# Patient Record
Sex: Male | Born: 1995 | Race: Black or African American | Hispanic: No | Marital: Single | Smoking: Former smoker
Health system: Southern US, Community
[De-identification: ages and names within clinical notes are randomized; demographics above are authoritative.]

## PROBLEM LIST (undated history)

## (undated) DIAGNOSIS — B20 Human immunodeficiency virus [HIV] disease: Secondary | ICD-10-CM

## (undated) DIAGNOSIS — Z21 Asymptomatic human immunodeficiency virus [HIV] infection status: Secondary | ICD-10-CM

## (undated) DIAGNOSIS — E669 Obesity, unspecified: Secondary | ICD-10-CM

## (undated) DIAGNOSIS — E119 Type 2 diabetes mellitus without complications: Secondary | ICD-10-CM

## (undated) HISTORY — DX: Asymptomatic human immunodeficiency virus (hiv) infection status: Z21

## (undated) HISTORY — DX: Human immunodeficiency virus (HIV) disease: B20

## (undated) HISTORY — DX: Obesity, unspecified: E66.9

## (undated) HISTORY — DX: Type 2 diabetes mellitus without complications: E11.9

---
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
## 2018-05-19 IMAGING — CR DG CHEST 2V
2 series · 2 of 2 positions shown · non-contrast
Comparison: 11/05/2009

CLINICAL DATA: Chest pain, productive cough

EXAM:
CHEST  2 VIEW

[chest pa]
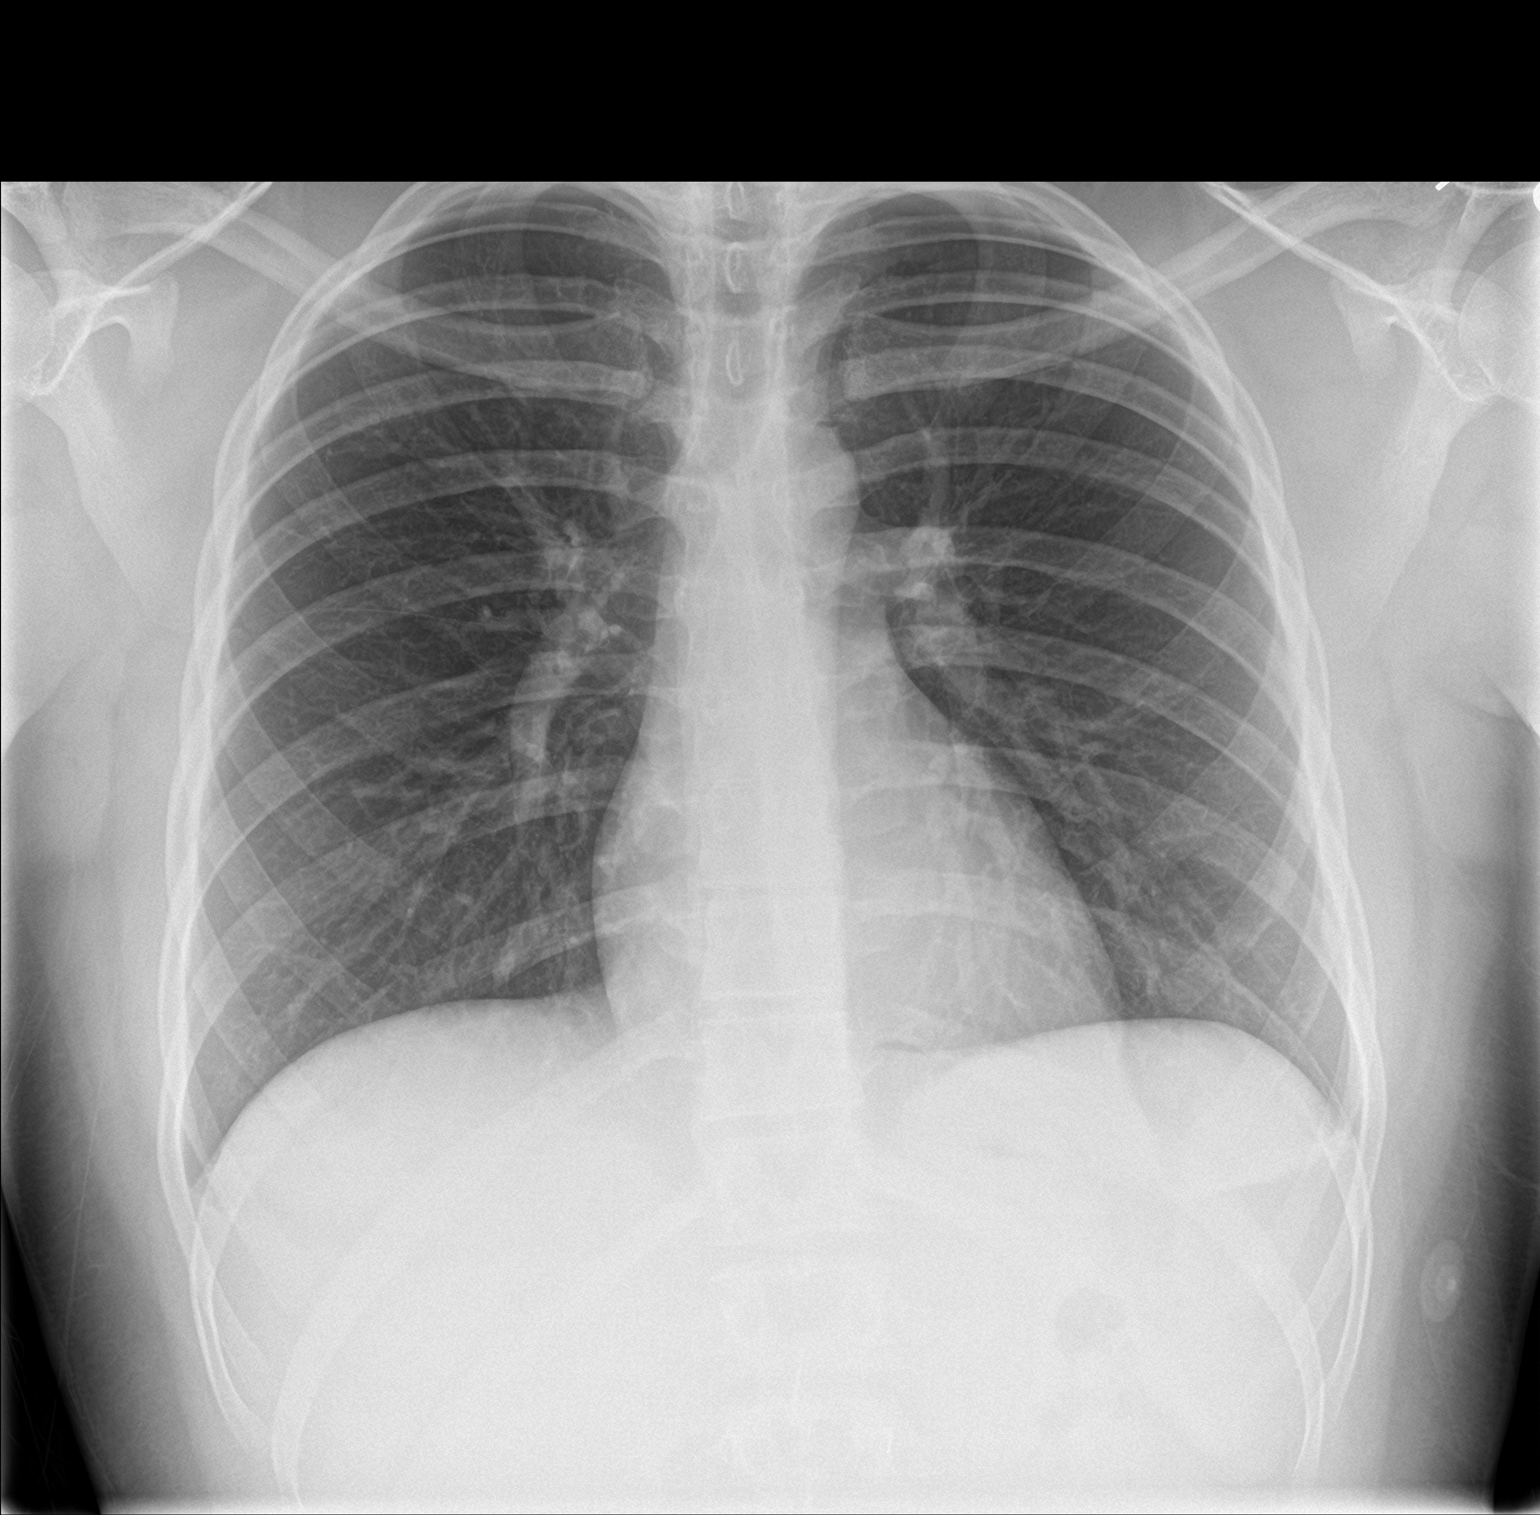

[chest lat]
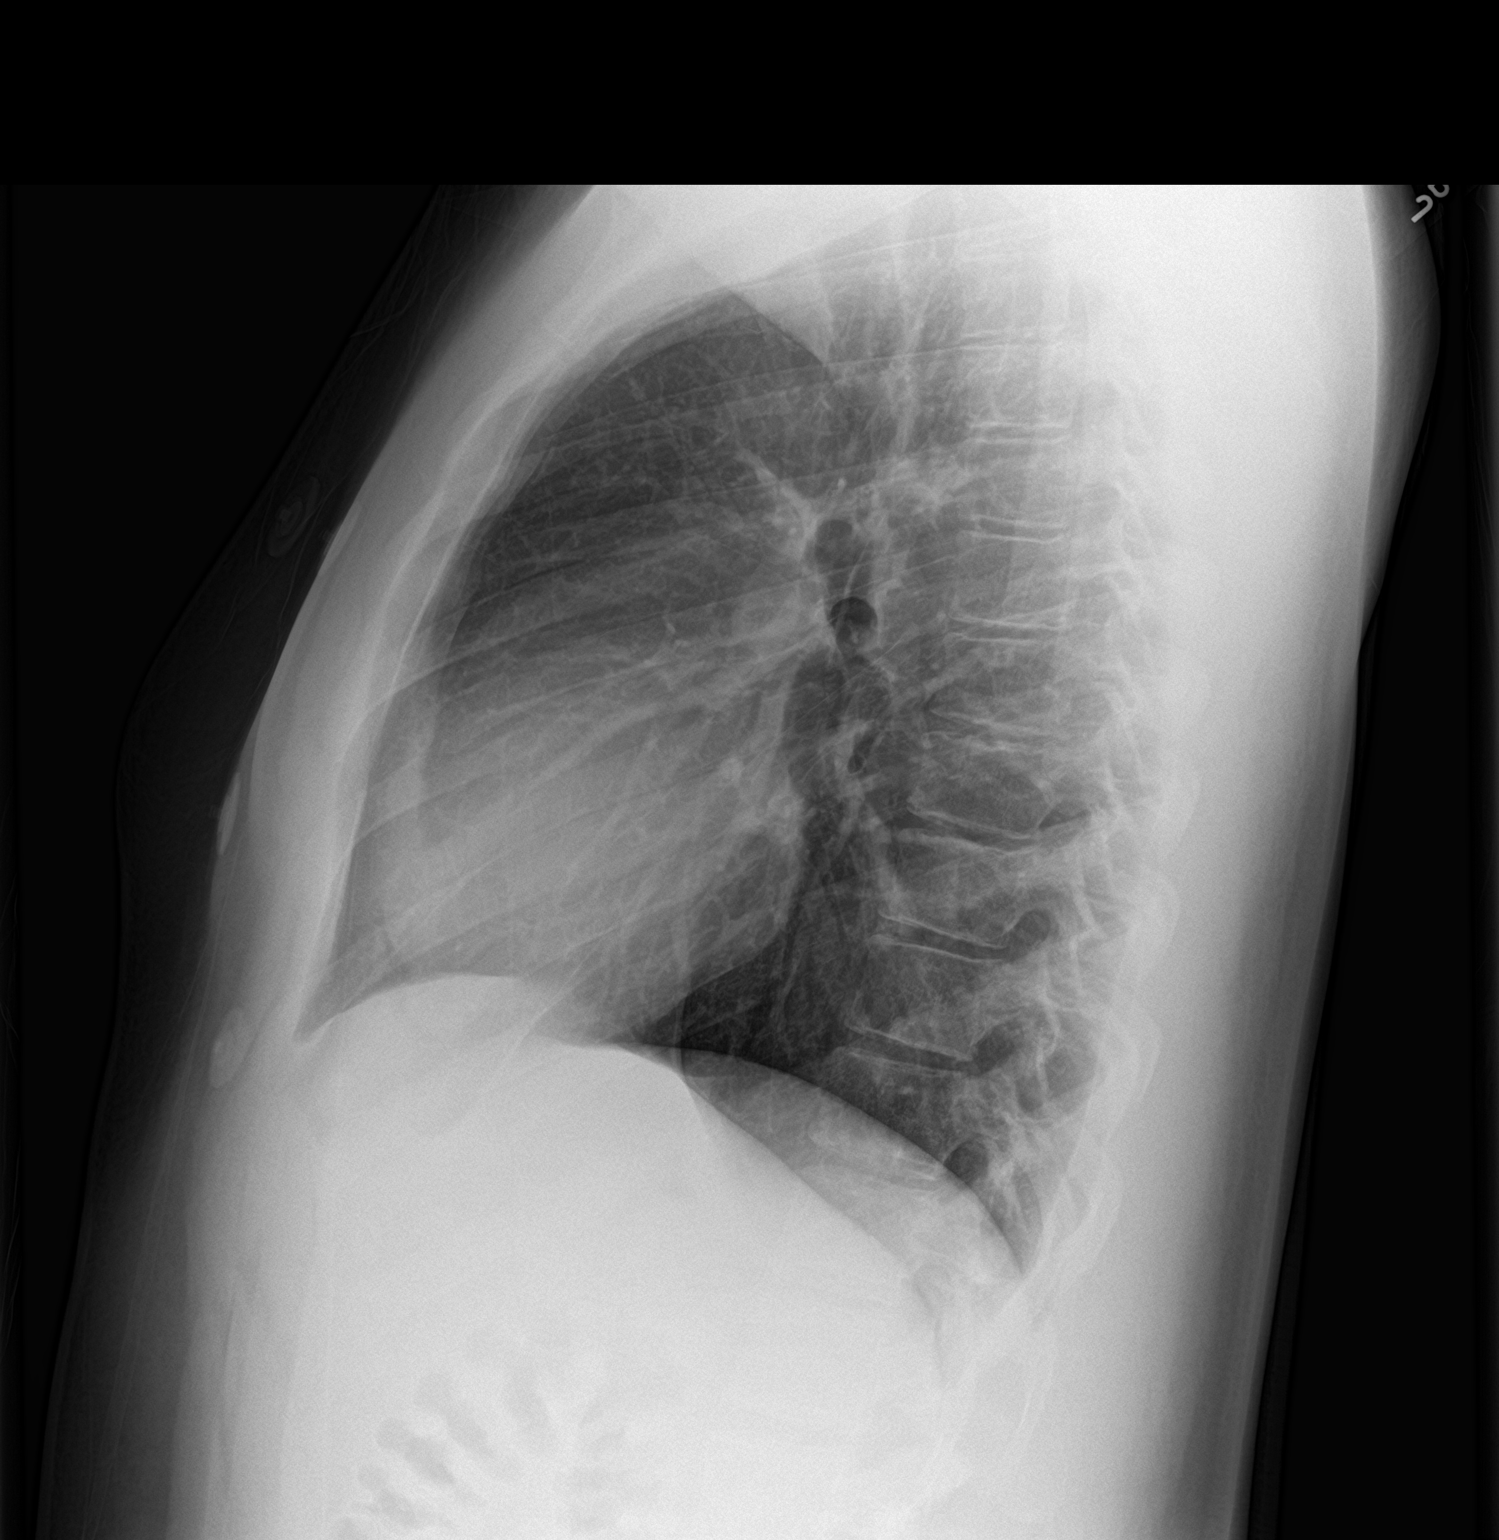

[2 of 2 positions shown; findings below may reference images not displayed]

FINDINGS: The heart size and mediastinal contours are within normal limits.
Both lungs are clear. The visualized skeletal structures are
unremarkable.
IMPRESSION: No active cardiopulmonary disease.

## 2019-04-28 ENCOUNTER — Ambulatory Visit: Payer: Medicaid Other | Attending: Internal Medicine

## 2019-04-28 DIAGNOSIS — Z23 Encounter for immunization: Secondary | ICD-10-CM

## 2019-04-28 NOTE — Progress Notes (Signed)
   Covid-19 Vaccination Clinic  Name:  Darryl Brown    MRN: [id number] DOB: 03-29-1995  04/28/2019  Darryl Brown was observed post Covid-19 immunization for 15 minutes without incident. He was provided with Vaccine Information Sheet and instruction to access the V-Safe system.   Darryl Brown was instructed to call [phone] with any severe reactions post vaccine: Marland Kitchen Difficulty breathing  . Swelling of face and throat  . A fast heartbeat  . A bad rash all over body  . Dizziness and weakness   Immunizations Administered    Name Date Dose VIS Date Route   Pfizer COVID-19 Vaccine 04/28/2019 10:47 AM 0.3 mL 12/30/2018 Intramuscular   Manufacturer: [employer], [employer]   Lot: [id number]   NDC: [address]-0

## 2019-05-22 ENCOUNTER — Ambulatory Visit: Payer: Medicaid Other | Attending: Internal Medicine

## 2019-05-22 DIAGNOSIS — Z23 Encounter for immunization: Secondary | ICD-10-CM

## 2019-05-22 NOTE — Progress Notes (Signed)
   Covid-19 Vaccination Clinic  Name:  Cree Kunert    MRN: [id number] DOB: 1995-03-07  05/22/2019  Mr. Gift was observed post Covid-19 immunization for 15 minutes without incident. He was provided with Vaccine Information Sheet and instruction to access the V-Safe system.   Mr. Baines was instructed to call 911 with any severe reactions post vaccine: Marland Kitchen Difficulty breathing  . Swelling of face and throat  . A fast heartbeat  . A bad rash all over body  . Dizziness and weakness   Immunizations Administered    Name Date Dose VIS Date Route   Pfizer COVID-19 Vaccine 05/22/2019 10:37 AM 0.3 mL 03/15/2018 Intramuscular   Manufacturer: [employer], [employer]   Lot: [id number]   NDC: [address]-7

## 2020-03-15 ENCOUNTER — Ambulatory Visit: Payer: Medicaid Other

## 2021-01-09 ENCOUNTER — Other Ambulatory Visit: Payer: Self-pay

## 2021-01-09 DIAGNOSIS — B20 Human immunodeficiency virus [HIV] disease: Secondary | ICD-10-CM

## 2021-01-09 DIAGNOSIS — Z113 Encounter for screening for infections with a predominantly sexual mode of transmission: Secondary | ICD-10-CM

## 2021-01-09 DIAGNOSIS — Z79899 Other long term (current) drug therapy: Secondary | ICD-10-CM

## 2021-01-15 ENCOUNTER — Other Ambulatory Visit: Payer: Medicaid Other

## 2021-01-15 ENCOUNTER — Ambulatory Visit: Payer: Self-pay

## 2021-01-29 ENCOUNTER — Encounter: Payer: Medicaid Other | Admitting: Internal Medicine

## 2021-01-29 ENCOUNTER — Ambulatory Visit: Payer: Medicaid Other | Admitting: Pharmacist

## 2021-01-30 ENCOUNTER — Ambulatory Visit (INDEPENDENT_AMBULATORY_CARE_PROVIDER_SITE_OTHER): Payer: Self-pay | Admitting: Internal Medicine

## 2021-01-30 ENCOUNTER — Ambulatory Visit: Payer: Self-pay

## 2021-01-30 ENCOUNTER — Other Ambulatory Visit: Payer: Self-pay

## 2021-01-30 ENCOUNTER — Ambulatory Visit (INDEPENDENT_AMBULATORY_CARE_PROVIDER_SITE_OTHER): Payer: Self-pay | Admitting: Pharmacist

## 2021-01-30 ENCOUNTER — Encounter: Payer: Self-pay | Admitting: Internal Medicine

## 2021-01-30 DIAGNOSIS — Z113 Encounter for screening for infections with a predominantly sexual mode of transmission: Secondary | ICD-10-CM | POA: Insufficient documentation

## 2021-01-30 DIAGNOSIS — E119 Type 2 diabetes mellitus without complications: Secondary | ICD-10-CM | POA: Insufficient documentation

## 2021-01-30 DIAGNOSIS — B20 Human immunodeficiency virus [HIV] disease: Secondary | ICD-10-CM

## 2021-01-30 DIAGNOSIS — Z79899 Other long term (current) drug therapy: Secondary | ICD-10-CM | POA: Insufficient documentation

## 2021-01-30 NOTE — Progress Notes (Signed)
Patient ID: Darryl Brown, male    DOB: 17-Feb-1995, 26 y.o.   MRN: [id number]  Reason for visit: to establish care as a new patient with established HIV  HPI:   Patient was first diagnosed in 2021 and started on Biktarvy at that time.  He was tested as part of risk factor screening.  He did not recall a previous negative test.  The CD4 count nadir was 270, viral load was just listed as detected.  He endorses MSM and sex with females as well.  He has a history of GC treated in 2019.  He has remained on Biktarvy.   He has noted some blood clots in his urine intermittently.  Never had this before.   Dose not have a PCP  Past Medical History:  Diagnosis Date   Diabetes mellitus without complication ([place])    HIV (human immunodeficiency virus infection) ([place])    Obesity     Prior to Admission medications   Medication Sig Start Date End Date Taking? Authorizing Provider  Cholecalciferol 1.25 MG (50000 UT) capsule Take 1 capsule by mouth once a week. 06/19/20  Yes [provider]  dapagliflozin propanediol (FARXIGA) 10 MG TABS tablet Take by mouth. 12/31/20 12/31/21 Yes [provider]  loratadine (CLARITIN) 10 MG tablet Take 1 tablet by mouth daily. 12/31/20  Yes [provider]  sitaGLIPtin (JANUVIA) 100 MG tablet Take by mouth. 12/31/20  Yes [provider]  acetaminophen (TYLENOL) 325 MG tablet Take 650 mg by mouth every 6 (six) hours as needed for mild pain.    [provider]  BIKTARVY 50-200-25 MG TABS tablet Take 1 tablet by mouth daily. 01/03/21   [provider]  escitalopram (LEXAPRO) 10 MG tablet Take 10 mg by mouth daily. 01/03/21   [provider]  glipiZIDE (GLUCOTROL XL) 10 MG 24 hr tablet Take 10 mg by mouth daily. 01/03/21   [provider]  ibuprofen (ADVIL,MOTRIN) 600 MG tablet Take 1 tablet (600 mg total) by mouth every 6 (six) hours as needed. Patient not taking: Reported on 12/27/2015 05/22/15   Marella Chimes, PA-C  insulin detemir (LEVEMIR) 100 UNIT/ML injection Inject 30 Units into the skin every morning.  Patient not taking: Reported on 01/30/2021    [provider]  metFORMIN (GLUCOPHAGE) 500 MG tablet Take 1 tablet (500 mg total) by mouth daily with breakfast. Patient not taking: Reported on 12/27/2015 12/11/14   Melony Overly, MD  promethazine-dextromethorphan (PROMETHAZINE-DM) 6.25-15 MG/5ML syrup Take 5 mLs by mouth 4 (four) times daily as needed for cough. Patient not taking: Reported on 12/27/2015 02/27/15   Domenic Moras, PA-C  Pseudoeph-CPM-DM-APAP (TYLENOL COLD) 30-2-15-325 MG TABS Take 1 tablet by mouth every 8 (eight) hours as needed (cold symptoms).    [provider]  traZODone (DESYREL) 100 MG tablet Take 1 tablet (100 mg total) by mouth at bedtime. Patient not taking: Reported on 12/27/2015 06/03/15   Billy Fischer, MD    Allergies  Allergen Reactions   Metformin Other (See Comments)   Sertraline Other (See Comments)    Night sweats    Social History   Tobacco Use   Smoking status: Every Day    Types: Cigarettes   Smokeless tobacco: Never  Vaping Use   Vaping Use: Every day  Substance Use Topics   Alcohol use: Yes    Comment: Pt states about 3 beers a week   Drug use: Yes    Types: Marijuana   PMH: +  cardiac disease  Review of Systems Constitutional: negative for sweats, fatigue, and malaise Gastrointestinal: negative for nausea and diarrhea Integument/breast: negative for rash Musculoskeletal: negative for myalgias and arthralgias All other systems reviewed and are negative    CONSTITUTIONAL:in no apparent distress  Vitals:   01/30/21 1021  BP: 127/80  Pulse: 94  Resp: 16  Temp: 98.9 F (37.2 C)  SpO2: 98%   EYES: anicteric HENT: no thrush CARD:Cor RRR RESP:clear MS:no pedal edema noted SKIN: no rash NEURO: non-focal  No results found for: HIV1RNAQUANT No components found for: HIV1GENOTYPRPLUS No components found for:  THELPERCELL  Assessment: new patient here with HIV.  Discussed with patient treatment options and side effects, benefits of treatment, long term outcomes.  I discussed the severity of untreated HIV including higher cancer risk, opportunistic infections, renal failure.  Also discussed needing to use condoms, partner disclosure, necessary vaccines, blood monitoring.  All questions answered.    Plan: 1)  continue Biktarvy 2) will get vaccine record from previous clinic 3) Dental referral placed today for [facility]. Information to schedule appointment completed today.   4) check urine studies and if no infection, referral to urology for evaluation of urinary blood clots 5) labs today

## 2021-01-31 LAB — URINE CYTOLOGY ANCILLARY ONLY
Chlamydia: NEGATIVE
Comment: NEGATIVE
Comment: NORMAL
Neisseria Gonorrhea: NEGATIVE

## 2021-01-31 LAB — T-HELPER CELL (CD4) - (RCID CLINIC ONLY)
CD4 % Helper T Cell: 31 % — ABNORMAL LOW (ref 33–65)
CD4 T Cell Abs: 289 /uL — ABNORMAL LOW (ref 400–1790)

## 2021-01-31 LAB — CYTOLOGY, (ORAL, ANAL, URETHRAL) ANCILLARY ONLY
Chlamydia: NEGATIVE
Chlamydia: POSITIVE — AB
Comment: NEGATIVE
Comment: NEGATIVE
Comment: NORMAL
Comment: NORMAL
Neisseria Gonorrhea: NEGATIVE
Neisseria Gonorrhea: NEGATIVE

## 2021-01-31 LAB — URINALYSIS, ROUTINE W REFLEX MICROSCOPIC
Bilirubin Urine: NEGATIVE
Hgb urine dipstick: NEGATIVE
Ketones, ur: NEGATIVE
Leukocytes,Ua: NEGATIVE
Nitrite: NEGATIVE
Protein, ur: NEGATIVE
Specific Gravity, Urine: 1.04 — ABNORMAL HIGH (ref 1.001–1.035)
pH: 7 (ref 5.0–8.0)

## 2021-01-31 NOTE — Progress Notes (Signed)
Met with patient today to discuss clinic pharmacy services. He does well with Biktarvy and denies any side effects. He takes it at random times so encouraged him to try and stay consistent from day to day. No drug interactions. He has a pharmacy that he fills his medications with. Told him to call/MyChart me with any issues.   Kasiah Manka L. Leshonda Galambos, PharmD, BCIDP, AAHIVP, CPP Clinical Pharmacist Practitioner [facility] 01/31/2021, 2:23 PM

## 2021-02-03 ENCOUNTER — Telehealth: Payer: Self-pay

## 2021-02-03 NOTE — Telephone Encounter (Signed)
Patient aware of STD results and is scheduled to come in for bicillin 2.4 million units x 1 and azithromycin 1 gram po on 02/04/2021.   Jonatha Gagen Lesli Albee, CMA

## 2021-02-03 NOTE — Telephone Encounter (Signed)
-----   Message from Gardiner Barefoot, MD sent at 02/03/2021  1:47 PM EST ----- He is positive for syphilis and chlamydia.  Please get him in for bicillin 2.4 million units x 1 dose and azithromcyin 1 gram.   thanks

## 2021-02-04 ENCOUNTER — Other Ambulatory Visit: Payer: Self-pay

## 2021-02-04 ENCOUNTER — Ambulatory Visit (INDEPENDENT_AMBULATORY_CARE_PROVIDER_SITE_OTHER): Payer: Self-pay

## 2021-02-04 DIAGNOSIS — A749 Chlamydial infection, unspecified: Secondary | ICD-10-CM

## 2021-02-04 DIAGNOSIS — A539 Syphilis, unspecified: Secondary | ICD-10-CM

## 2021-02-04 MED ORDER — AZITHROMYCIN 250 MG PO TABS
1000.0000 mg | ORAL_TABLET | Freq: Once | ORAL | Status: AC
  Administered 2021-02-04: 1000 mg via ORAL

## 2021-02-04 MED ORDER — PENICILLIN G BENZATHINE 1200000 UNIT/2ML IM SUSY
1.2000 10*6.[IU] | PREFILLED_SYRINGE | Freq: Once | INTRAMUSCULAR | Status: AC
  Administered 2021-02-04: 1.2 10*6.[IU] via INTRAMUSCULAR

## 2021-02-04 NOTE — Progress Notes (Signed)
Patient in office today for chlamydia and syphilis treatment, Bicillin 2.4 MU x 1 and Azithromycin 1 g. Patient denied any medication allergies. Patient advised no sex for 10 days after treatment. Patient declined condoms and tolerated both medications.  Durinda Buzzelli T Brooks Sailors

## 2021-02-05 LAB — CBC WITH DIFFERENTIAL/PLATELET
Absolute Monocytes: 307 cells/uL (ref 200–950)
Basophils Absolute: 19 cells/uL (ref 0–200)
Basophils Relative: 0.4 %
Eosinophils Absolute: 38 cells/uL (ref 15–500)
Eosinophils Relative: 0.8 %
HCT: 44.8 % (ref 38.5–50.0)
Hemoglobin: 15.2 g/dL (ref 13.2–17.1)
Lymphs Abs: 1013 cells/uL (ref 850–3900)
MCH: 28.7 pg (ref 27.0–33.0)
MCHC: 33.9 g/dL (ref 32.0–36.0)
MCV: 84.5 fL (ref 80.0–100.0)
MPV: 10.8 fL (ref 7.5–12.5)
Monocytes Relative: 6.4 %
Neutro Abs: 3422 cells/uL (ref 1500–7800)
Neutrophils Relative %: 71.3 %
Platelets: 263 10*3/uL (ref 140–400)
RBC: 5.3 10*6/uL (ref 4.20–5.80)
RDW: 12.9 % (ref 11.0–15.0)
Total Lymphocyte: 21.1 %
WBC: 4.8 10*3/uL (ref 3.8–10.8)

## 2021-02-05 LAB — FLUORESCENT TREPONEMAL AB(FTA)-IGG-BLD: Fluorescent Treponemal ABS: REACTIVE — AB

## 2021-02-05 LAB — LIPID PANEL
Cholesterol: 219 mg/dL — ABNORMAL HIGH (ref <200)
HDL: 44 mg/dL (ref >=40)
LDL Cholesterol (Calc): 153 mg/dL (calc) — ABNORMAL HIGH
Non-HDL Cholesterol (Calc): 175 mg/dL (calc) — ABNORMAL HIGH (ref <130)
Total CHOL/HDL Ratio: 5 (calc) — ABNORMAL HIGH (ref <5.0)
Triglycerides: 104 mg/dL (ref <150)

## 2021-02-05 LAB — HIV-1/2 AB - DIFFERENTIATION
HIV-1 antibody: POSITIVE — AB
HIV-2 Ab: NEGATIVE

## 2021-02-05 LAB — QUANTIFERON-TB GOLD PLUS
Mitogen-NIL: >10 IU/mL
NIL: 0.06 IU/mL
QuantiFERON-TB Gold Plus: NEGATIVE
TB1-NIL: <0 IU/mL
TB2-NIL: 0 IU/mL

## 2021-02-05 LAB — HEPATITIS B SURFACE ANTIBODY,QUALITATIVE: Hep B S Ab: REACTIVE — AB

## 2021-02-05 LAB — COMPLETE METABOLIC PANEL WITH GFR
AG Ratio: 1.1 (calc) (ref 1.0–2.5)
ALT: 11 U/L (ref 9–46)
AST: 10 U/L (ref 10–40)
Albumin: 4.1 g/dL (ref 3.6–5.1)
Alkaline phosphatase (APISO): 74 U/L (ref 36–130)
BUN: 10 mg/dL (ref 7–25)
CO2: 27 mmol/L (ref 20–32)
Calcium: 9.7 mg/dL (ref 8.6–10.3)
Chloride: 102 mmol/L (ref 98–110)
Creat: 0.94 mg/dL (ref 0.60–1.24)
Globulin: 3.7 g/dL (calc) (ref 1.9–3.7)
Glucose, Bld: 233 mg/dL — ABNORMAL HIGH (ref 65–99)
Potassium: 4.1 mmol/L (ref 3.5–5.3)
Sodium: 136 mmol/L (ref 135–146)
Total Bilirubin: 0.7 mg/dL (ref 0.2–1.2)
Total Protein: 7.8 g/dL (ref 6.1–8.1)
eGFR: 117 mL/min/{1.73_m2} (ref >=60)

## 2021-02-05 LAB — RPR TITER: RPR Titer: 1:32 {titer} — ABNORMAL HIGH

## 2021-02-05 LAB — HEPATITIS A ANTIBODY, TOTAL: Hepatitis A AB,Total: REACTIVE — AB

## 2021-02-05 LAB — HIV-1 RNA ULTRAQUANT REFLEX TO GENTYP+
HIV 1 RNA Quant: 55 copies/mL — ABNORMAL HIGH
HIV-1 RNA Quant, Log: 1.74 Log copies/mL — ABNORMAL HIGH

## 2021-02-05 LAB — HEPATITIS B SURFACE ANTIGEN: Hepatitis B Surface Ag: NONREACTIVE

## 2021-02-05 LAB — HEPATITIS C ANTIBODY
Hepatitis C Ab: NONREACTIVE
SIGNAL TO CUT-OFF: 0.77 (ref <1.00)

## 2021-02-05 LAB — RPR: RPR Ser Ql: REACTIVE — AB

## 2021-02-05 LAB — HIV ANTIBODY (ROUTINE TESTING W REFLEX): HIV 1&2 Ab, 4th Generation: REACTIVE — AB

## 2021-02-21 ENCOUNTER — Encounter: Payer: Self-pay | Admitting: Internal Medicine

## 2021-02-24 ENCOUNTER — Telehealth: Payer: Self-pay

## 2021-02-24 NOTE — Telephone Encounter (Signed)
Brooks, DIS called office regarding patient's RPR titer and to confirm patient has been treated. Informed DIS that patient was treated with Bicillin 2.4 million units x1. Would like to know if Pt would need x3 series or just x1 based off titer.  RPR was 1:32  Will forward message to MD. Shon Baton, Dis [phone] Juanita Laster, RMA

## 2021-03-11 ENCOUNTER — Ambulatory Visit: Payer: Self-pay | Admitting: Internal Medicine

## 2021-05-08 ENCOUNTER — Ambulatory Visit: Payer: Medicaid Other | Admitting: Internal Medicine

## 2021-06-20 ENCOUNTER — Encounter: Payer: Self-pay | Admitting: Student

## 2021-06-20 ENCOUNTER — Other Ambulatory Visit (HOSPITAL_COMMUNITY): Payer: Self-pay

## 2021-06-20 ENCOUNTER — Ambulatory Visit (INDEPENDENT_AMBULATORY_CARE_PROVIDER_SITE_OTHER): Payer: Self-pay | Admitting: Student

## 2021-06-20 ENCOUNTER — Other Ambulatory Visit: Payer: Self-pay | Admitting: Pharmacist

## 2021-06-20 ENCOUNTER — Telehealth: Payer: Self-pay

## 2021-06-20 ENCOUNTER — Other Ambulatory Visit: Payer: Self-pay | Admitting: Internal Medicine

## 2021-06-20 VITALS — BP 124/64 | HR 90 | Temp 98.7°F | Resp 24 | Ht 73.0 in | Wt 222.4 lb

## 2021-06-20 DIAGNOSIS — B20 Human immunodeficiency virus [HIV] disease: Secondary | ICD-10-CM

## 2021-06-20 DIAGNOSIS — E119 Type 2 diabetes mellitus without complications: Secondary | ICD-10-CM

## 2021-06-20 DIAGNOSIS — A539 Syphilis, unspecified: Secondary | ICD-10-CM

## 2021-06-20 DIAGNOSIS — Z7984 Long term (current) use of oral hypoglycemic drugs: Secondary | ICD-10-CM

## 2021-06-20 DIAGNOSIS — A749 Chlamydial infection, unspecified: Secondary | ICD-10-CM

## 2021-06-20 LAB — POCT GLYCOSYLATED HEMOGLOBIN (HGB A1C): Hemoglobin A1C: 9.5 % — AB (ref 4.0–5.6)

## 2021-06-20 LAB — GLUCOSE, CAPILLARY: Glucose-Capillary: 283 mg/dL — ABNORMAL HIGH (ref 70–99)

## 2021-06-20 MED ORDER — UNIFINE PENTIPS 32G X 4 MM MISC
1.0000 [IU] | Freq: Every day | 3 refills | Status: AC
  Filled 2021-06-20: qty 100, 90d supply, fill #0

## 2021-06-20 MED ORDER — LIRAGLUTIDE 18 MG/3ML ~~LOC~~ SOPN
0.6000 mg | PEN_INJECTOR | Freq: Every day | SUBCUTANEOUS | 2 refills | Status: DC
  Filled 2021-06-20: qty 6, 60d supply, fill #0

## 2021-06-20 MED ORDER — DAPAGLIFLOZIN PROPANEDIOL 10 MG PO TABS
10.0000 mg | ORAL_TABLET | Freq: Every day | ORAL | 3 refills | Status: DC
  Filled 2021-06-20: qty 30, 30d supply, fill #0

## 2021-06-20 MED ORDER — GLIPIZIDE ER 10 MG PO TB24
10.0000 mg | ORAL_TABLET | Freq: Every day | ORAL | 3 refills | Status: DC
  Filled 2021-06-20: qty 30, 30d supply, fill #0

## 2021-06-20 MED ORDER — SITAGLIPTIN PHOSPHATE 100 MG PO TABS
100.0000 mg | ORAL_TABLET | Freq: Every day | ORAL | 3 refills | Status: DC
  Filled 2021-06-20: qty 30, 30d supply, fill #0

## 2021-06-20 MED ORDER — BICTEGRAVIR-EMTRICITAB-TENOFOV 50-200-25 MG PO TABS: 1.0000 | ORAL_TABLET | Freq: Every day | ORAL | 0 refills | Status: AC

## 2021-06-20 NOTE — Assessment & Plan Note (Signed)
Follows Infectious Disease for this. Missed his last appointment but was able to call them today and reschedule for next Tuesday. Will need continued follow up for management of HIV, currently on Biktarvy.

## 2021-06-20 NOTE — Assessment & Plan Note (Addendum)
Patient with history of uncontrolled T2DM, last A1c of 12.1% in 06/2020. Presenting here to f/u T2DM and to establish PCP. He is currently taking Januvia 100mg  daily, farxiga 10mg  daily, and glipizide 10mg  daily. A1c today of 9.5% which is improved but above goal. Discussed at length about lifestyle modifications. 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 Tries to avoid fried/fatty foods but not always possible. He does endorse becoming more sedentary recently due to exhaustion with work. Discussed moderate intensity exercise (150 mins/week) to help with glycemic control. He confirms understanding. He is currently uninsured, making options somewhat limited for additional therapies. However, he is willing to try victoza daily for concomitant weight loss. Will add low dose victoza to current medications and schedule f/u visit in 2 weeks to ensure tolerance with plans to uptitrate as able.   Plan: -continue glipizide, farxiga (refilled all three of these today) -added victoza 0.6u daily + pen needles -f/u in 2 weeks to assess tolerance and uptitrate dose of victoza as able -repeat A1c in 3 months  ADDENDUM: -will discontinue januvia in the setting of starting GLP-1 agonist. Notified patient of this (he has not picked up refill for yet).

## 2021-06-20 NOTE — Assessment & Plan Note (Signed)
Will need confirmatory testing with ID in 1 week.

## 2021-06-20 NOTE — Patient Instructions (Signed)
Darryl Brown,  It was a pleasure seeing you in the clinic today.   I have refilled your januvia, glipizide, and farxiga to [facility]. I have also added a new medicine called Victoza, which is a daily injection medicine that will help with your diabetes and help with weight loss. Please make sure to schedule a follow up visit with Infectious Disease. Please come back in 2 weeks to make sure you are not having any side effects to Victoza.  Please call our clinic at [phone] if you have any questions or concerns. The best time to call is Monday-Friday from 9am-4pm, but there is someone available 24/7 at the same number. If you need medication refills, please notify your pharmacy one week in advance and they will send [place] a request.   Thank you for letting us take part in your care. We look forward to seeing you next time!

## 2021-06-20 NOTE — Assessment & Plan Note (Signed)
Plan for confirmatory testing for eradication with ID.

## 2021-06-20 NOTE — Progress Notes (Signed)
CC: f/u of T2DM, new to establish PCP  HPI:  [name] is a 26 y.o. male with history listed below presenting to the [facility] for f/u of T2DM, new to establish PCP. Please see individualized problem based charting for full HPI.  Past Medical History:  Diagnosis Date   Diabetes mellitus without complication ([facility])    HIV (human immunodeficiency virus infection) ([facility])    Obesity    Past Surgical History: None  Family History:  Father - passed away when patient was 5, unsure of history Mother - CHF, HTN, HLD, T2DM -states HTN, HLD, T2DM "runs in the family"  Current Outpatient Medications on File Prior to Visit  Medication Sig Dispense Refill   acetaminophen (TYLENOL) 325 MG tablet Take 650 mg by mouth every 6 (six) hours as needed for mild pain.     BIKTARVY 50-200-25 MG TABS tablet Take 1 tablet by mouth daily.     Cholecalciferol 1.25 MG (50000 UT) capsule Take 1 capsule by mouth once a week.     escitalopram (LEXAPRO) 10 MG tablet Take 10 mg by mouth daily.     loratadine (CLARITIN) 10 MG tablet Take 1 tablet by mouth daily.     Pseudoeph-CPM-DM-APAP (TYLENOL COLD) 30-2-15-325 MG TABS Take 1 tablet by mouth every 8 (eight) hours as needed (cold symptoms).     No current facility-administered medications on file prior to visit.  Glipizide 10mg  daily Januvia 100mg  daily Farxiga 10mg  daily   Allergies  Allergen Reactions   Metformin Other (See Comments)   Sertraline Other (See Comments)    Night sweats   Social History   Socioeconomic History   Marital status: Single    Spouse name: Not on file   Number of children: Not on file   Years of education: Not on file   Highest education level: Not on file  Occupational History   Not on file  Tobacco Use   Smoking status: Every Day    Types: Cigarettes   Smokeless tobacco: Never   Tobacco comments:    Black and Milds 2-3 per day  Vaping Use   Vaping Use: Every day  Substance and Sexual Activity   Alcohol use:  Yes    Comment: Pt states about 3 beers a week   Drug use: Yes    Types: Marijuana   Sexual activity: Not on file  Other Topics Concern   Not on file  Social History Narrative   Not on file   Social Determinants of Health   Financial Resource Strain: Not on file  Food Insecurity: Not on file  Transportation Needs: Not on file  Physical Activity: Not on file  Stress: Not on file  Social Connections: Not on file  Intimate Partner Violence: Not on file    Review of Systems:  Negative aside from that listed in individualized problem based charting.  Physical Exam:  Vitals:   06/20/21 1003  BP: 124/64  Pulse: 90  Resp: (!) 24  Temp: 98.7 F (37.1 C)  TempSrc: Oral  SpO2: 99%  Weight: 222 lb 6.4 oz (100.9 kg)  Height: 6\' 1"  (1.854 m)   Physical Exam Constitutional:      Appearance: Normal appearance. He is not ill-appearing.  HENT:     Nose: Nose normal. No congestion.     Mouth/Throat:     Mouth: Mucous membranes are moist.     Pharynx: Oropharynx is clear. No oropharyngeal exudate.  Eyes:     Extraocular Movements: Extraocular movements  intact.     Conjunctiva/sclera: Conjunctivae normal.     Pupils: Pupils are equal, round, and reactive to light.  Cardiovascular:     Rate and Rhythm: Normal rate and regular rhythm.     Pulses: Normal pulses.     Heart sounds: Normal heart sounds. No murmur heard.   No friction rub. No gallop.  Pulmonary:     Effort: Pulmonary effort is normal.     Breath sounds: Normal breath sounds. No wheezing, rhonchi or rales.  Abdominal:     General: Bowel sounds are normal. There is no distension.     Palpations: Abdomen is soft.     Tenderness: There is no abdominal tenderness.  Musculoskeletal:        General: No swelling. Normal range of motion.  Skin:    General: Skin is warm and dry.  Neurological:     General: No focal deficit present.     Mental Status: He is alert and oriented to person, place, and time.  Psychiatric:         Mood and Affect: Mood normal.        Behavior: Behavior normal.        Thought Content: Thought content normal.     Assessment & Plan:   See Encounters Tab for problem based charting.  Patient discussed with Dr.  Sol Blazing

## 2021-06-20 NOTE — Progress Notes (Signed)
Medication Samples have been provided to the patient.  Drug name: Biktarvy        Strength: 50/200/25 mg       Qty: 14 tablets (2 bottles) LOT: [id number]   Exp.Date: 9/25  Dosing instructions: Take one tablet by mouth once daily  The patient has been instructed regarding the correct time, dose, and frequency of taking this medication, including desired effects and most common side effects.   Braxtyn Bojarski, PharmD, CPP Clinical Pharmacist Practitioner Infectious Diseases Clinical Pharmacist [facility]  

## 2021-06-20 NOTE — Telephone Encounter (Signed)
Patient called for Biktarvy refills Per Dr Luciana Axe okay to give 2 bottles of samples as he needs to come in to see finance on 06/24/21 and see Dr Luciana Axe. I gave him samples at front desk and did express the importance of keeping this appointment in order to remain on medications. I also reminded him that he needs to see finance and had Aileen schedule that for 06/24/21.

## 2021-06-23 NOTE — Addendum Note (Signed)
Addended by: Virl Axe on: 06/23/2021 10:15 AM   Modules accepted: Orders

## 2021-06-23 NOTE — Progress Notes (Signed)
Internal Medicine Clinic Attending  Case discussed with Dr. Jinwala  At the time of the visit.  We reviewed the resident's history and exam and pertinent patient test results.  I agree with the assessment, diagnosis, and plan of care documented in the resident's note.  

## 2021-06-24 ENCOUNTER — Encounter: Payer: Self-pay | Admitting: Internal Medicine

## 2021-06-24 ENCOUNTER — Ambulatory Visit (INDEPENDENT_AMBULATORY_CARE_PROVIDER_SITE_OTHER): Payer: Self-pay | Admitting: Internal Medicine

## 2021-06-24 ENCOUNTER — Other Ambulatory Visit: Payer: Self-pay

## 2021-06-24 ENCOUNTER — Ambulatory Visit: Payer: Self-pay

## 2021-06-24 VITALS — BP 123/80 | HR 86 | Temp 98.9°F | Wt 220.2 lb

## 2021-06-24 DIAGNOSIS — A539 Syphilis, unspecified: Secondary | ICD-10-CM

## 2021-06-24 DIAGNOSIS — B20 Human immunodeficiency virus [HIV] disease: Secondary | ICD-10-CM

## 2021-06-24 DIAGNOSIS — Z113 Encounter for screening for infections with a predominantly sexual mode of transmission: Secondary | ICD-10-CM

## 2021-06-24 DIAGNOSIS — Z23 Encounter for immunization: Secondary | ICD-10-CM | POA: Insufficient documentation

## 2021-06-24 NOTE — Addendum Note (Signed)
Addended by: Tomi Bamberger on: 06/24/2021 02:49 PM   Modules accepted: Orders

## 2021-06-24 NOTE — Progress Notes (Signed)
   Subjective:    Patient ID: Darryl Brown, male    DOB: 07-26-1995, 26 y.o.   MRN: [id number]  HPI Here for follow up of HIV He continues on Biktarvy though was out after not renewing the drug assistance program.  He was seen as a new patient in January and has not been back since.  He was treated for syphilis previously.  He is now established with a PCP.  CD4 289 and viral load 55 last visit.     Review of Systems  Constitutional:  Negative for fatigue.  Gastrointestinal:  Negative for diarrhea.  Skin:  Negative for rash.      Objective:   Physical Exam Eyes:     General: No scleral icterus. Skin:    Findings: No rash.  Neurological:     Mental Status: He is alert.    SH: + tobacco      Assessment & Plan:

## 2021-06-24 NOTE — Assessment & Plan Note (Signed)
Will recheck titer

## 2021-06-24 NOTE — Assessment & Plan Note (Signed)
Will give Prevnar 20 and start HPV series.

## 2021-06-24 NOTE — Assessment & Plan Note (Signed)
He is doing well, back on the medication and no issues.  I will check labs today and include a genotype.  Drug assistance renewed today and he can rtc in 3 months.

## 2021-06-25 LAB — T-HELPER CELL (CD4) - (RCID CLINIC ONLY)
CD4 % Helper T Cell: 32 % — ABNORMAL LOW (ref 33–65)
CD4 T Cell Abs: 433 /uL (ref 400–1790)

## 2021-06-26 ENCOUNTER — Other Ambulatory Visit: Payer: Self-pay | Admitting: Pharmacist

## 2021-06-26 DIAGNOSIS — B20 Human immunodeficiency virus [HIV] disease: Secondary | ICD-10-CM

## 2021-06-26 MED ORDER — BIKTARVY 50-200-25 MG PO TABS: 1.0000 | ORAL_TABLET | Freq: Every day | ORAL | 0 refills | Status: AC

## 2021-06-26 NOTE — Progress Notes (Signed)
Medication Samples have been provided to the patient.  Drug name: Biktarvy        Strength: 50/200/25 mg       Qty: 1 bottle (7 tablets)   LOT: [id number]   Exp.Date: 09/2023  Dosing instructions: Take one tablet by mouth once daily  The patient has been instructed regarding the correct time, dose, and frequency of taking this medication, including desired effects and most common side effects.   Saniya Tranchina L. Rubi Tooley, PharmD, BCIDP, AAHIVP, CPP Clinical Pharmacist Practitioner Infectious Diseases Clinical Pharmacist [facility] 01/01/2020, 10:07 AM  

## 2021-07-04 ENCOUNTER — Encounter: Payer: Medicaid Other | Admitting: Student

## 2021-07-11 LAB — HIV-1 GENOTYPING (RTI,PI,IN INHBTR)
Date Viral Load Collected: 6062023
HIV-1 Genotype: NOT DETECTED

## 2021-07-11 LAB — RPR: RPR Ser Ql: REACTIVE — AB

## 2021-07-11 LAB — FLUORESCENT TREPONEMAL AB(FTA)-IGG-BLD: Fluorescent Treponemal ABS: REACTIVE — AB

## 2021-07-11 LAB — RPR TITER: RPR Titer: 1:2 {titer} — ABNORMAL HIGH

## 2021-07-11 LAB — HIV-1 RNA QUANT-NO REFLEX-BLD
HIV 1 RNA Quant: NOT DETECTED Copies/mL
HIV-1 RNA Quant, Log: NOT DETECTED Log cps/mL

## 2021-08-13 ENCOUNTER — Other Ambulatory Visit: Payer: Self-pay | Admitting: Pharmacist

## 2021-08-13 DIAGNOSIS — B20 Human immunodeficiency virus [HIV] disease: Secondary | ICD-10-CM

## 2021-08-13 MED ORDER — BIKTARVY 50-200-25 MG PO TABS: 1.0000 | ORAL_TABLET | Freq: Every day | ORAL | 3 refills | Status: DC

## 2021-08-13 NOTE — Progress Notes (Signed)
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

## 2021-09-23 ENCOUNTER — Ambulatory Visit: Payer: Self-pay | Admitting: Internal Medicine

## 2021-12-17 ENCOUNTER — Other Ambulatory Visit (HOSPITAL_COMMUNITY): Payer: Self-pay

## 2021-12-18 ENCOUNTER — Telehealth: Payer: Self-pay | Admitting: Internal Medicine

## 2021-12-18 NOTE — Telephone Encounter (Signed)
Retuned pt call no voicemail to leave message

## 2022-01-22 ENCOUNTER — Other Ambulatory Visit: Payer: Self-pay | Admitting: Pharmacist

## 2022-01-22 DIAGNOSIS — B20 Human immunodeficiency virus [HIV] disease: Secondary | ICD-10-CM

## 2022-01-22 MED ORDER — BIKTARVY 50-200-25 MG PO TABS: 1.0000 | ORAL_TABLET | Freq: Every day | ORAL | 0 refills | Status: AC

## 2022-01-22 NOTE — Progress Notes (Signed)
Medication Samples have been provided to the patient.  Drug name: Biktarvy        Strength: 50/200/25 mg       Qty: 14 tablets (2 bottles)  LOT: [id number]   Exp.Date: 03/2024  Dosing instructions: Take one tablet by mouth once daily  The patient has been instructed regarding the correct time, dose, and frequency of taking this medication, including desired effects and most common side effects.   Kaisa Wofford L. Tyyne Cliett, PharmD, BCIDP, AAHIVP, CPP Clinical Pharmacist Practitioner Infectious Diseases Clinical Pharmacist [facility] 01/01/2020, 10:07 AM  

## 2022-01-28 ENCOUNTER — Ambulatory Visit: Payer: Self-pay | Admitting: Internal Medicine

## 2022-02-03 ENCOUNTER — Encounter: Payer: Self-pay | Admitting: Student

## 2022-02-27 ENCOUNTER — Other Ambulatory Visit: Payer: Self-pay

## 2022-02-27 ENCOUNTER — Encounter: Payer: Self-pay | Admitting: Physician Assistant

## 2022-02-27 ENCOUNTER — Ambulatory Visit (INDEPENDENT_AMBULATORY_CARE_PROVIDER_SITE_OTHER): Payer: Self-pay | Admitting: Physician Assistant

## 2022-02-27 VITALS — BP 133/83 | HR 86 | Temp 98.3°F | Ht 73.0 in | Wt 220.0 lb

## 2022-02-27 DIAGNOSIS — B20 Human immunodeficiency virus [HIV] disease: Secondary | ICD-10-CM

## 2022-02-27 DIAGNOSIS — B354 Tinea corporis: Secondary | ICD-10-CM

## 2022-02-27 DIAGNOSIS — Z113 Encounter for screening for infections with a predominantly sexual mode of transmission: Secondary | ICD-10-CM

## 2022-02-27 DIAGNOSIS — A549 Gonococcal infection, unspecified: Secondary | ICD-10-CM

## 2022-02-27 DIAGNOSIS — E119 Type 2 diabetes mellitus without complications: Secondary | ICD-10-CM

## 2022-02-27 MED ORDER — LIRAGLUTIDE 18 MG/3ML ~~LOC~~ SOPN: 0.6000 mg | PEN_INJECTOR | Freq: Every day | SUBCUTANEOUS | 0 refills | Status: AC

## 2022-02-27 MED ORDER — CEFTRIAXONE SODIUM 500 MG IJ SOLR
500.0000 mg | Freq: Once | INTRAMUSCULAR | Status: AC
  Administered 2022-02-27: 500 mg via INTRAMUSCULAR

## 2022-02-27 MED ORDER — CLOTRIMAZOLE 1 % EX CREA: 1.0000 | TOPICAL_CREAM | Freq: Two times a day (BID) | CUTANEOUS | 0 refills | Status: AC

## 2022-02-27 MED ORDER — GLIPIZIDE ER 10 MG PO TB24: 10.0000 mg | ORAL_TABLET | Freq: Every day | ORAL | 0 refills | Status: AC

## 2022-02-27 MED ORDER — DAPAGLIFLOZIN PROPANEDIOL 10 MG PO TABS: 10.0000 mg | ORAL_TABLET | Freq: Every day | ORAL | 0 refills | Status: AC

## 2022-02-27 MED ORDER — DOXYCYCLINE HYCLATE 100 MG PO TABS: 100.0000 mg | ORAL_TABLET | Freq: Two times a day (BID) | ORAL | 0 refills | Status: AC

## 2022-02-27 NOTE — Patient Instructions (Addendum)
[place]this will help with career pathway Will defer DM2 management to Dr. Soyla Murphy Refill Wilder Glade victoza and glipizide Clotrimazole for rash Follow up in 3 months for HIV care  Doxycycline 100 mg twice daily x 7 days will cancel if results return negative for chlamydia Rocephin today for gonorrhea Condoms always

## 2022-02-27 NOTE — Progress Notes (Signed)
Subjective:    Patient ID: Darryl Brown, male    DOB: [birth year], 27 y.o.   MRN: [id number]  Chief Complaint  Patient presents with   Follow-up    B20 rash on body and discharge      HPI:  Darryl Brown is a 27 y.o. male presents today for follow up regarding HIV-1.  Last OPV 06/20/21 VL ND and CD4 433. He has been taking Biktarvy and tolerating well.  He has had a few lapses when he forgets to renew insurance [employer]). The last laps was for 4 weeks last year. 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  He is living alone in apartment. Working on transportation and returning to school and beginning career.  He feels he has become complacent with working all the time.  He denies fever, chills, weight loss, HA, diarrhea, constipation, abd pain, CP, dizziness, weakness.  Depressed mood: He has always been a "loner" as an only child.  He has support from friends and family, but will not ask for help.  He enjoys time alone to himself.  He is drinking 2-3 glasses of alcohol every night as a "coping mechanism."  He has a history of suicide attempt and inpatient behvioral health stay with counseling > 5 years ago.  He denies suicidal thoughts, intent or plans currently.  His mood is down, apart from work not motivated to spend time with friends and family. Sleeping more. Hopeful for his future to start his career and return to school.  He attended college until sophomore year at [employer]. 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 He has started taking lexapro 10 mg again this week. It has been effective in the past. He does not want to attend counseling at this time.   STI: Reports "white" milky penile discharge x 1 week.  He is cisgender male, engages in sex with men.  Condoms used 50% of the time, 2 new  partners. He has a history of syphilis and chlamydia.  DM2: has not been well controlled.  Last OPV 06/20/21 A1c 9.0.  He reports urinary frequency, fatigue and some patches of "itchy" skin in intertriginous regions (below right breast and left inguinal region.) He was diagnosed in 2017 has been managed by PCP Dr. Soyla Brown.  He has not been adhereing to regimens.  He has not taken farxiga (SGLT2) and has had patchy adherence with glipizide and Victoza (GLP-1).  His diet has been poor overall.  Increased carbohydrates and increased hunger. He has gained some weight (220 lbs-would like to be 175 lbs). He denies blurry vision, numbness tingling in extremities, HA.  He reports overall feeling fatigued and mood down due to feeling over worked.          Allergies  Allergen Reactions   Metformin Other (See Comments)   Sertraline Other (See Comments)    Night sweats      Outpatient Medications Prior to Visit  Medication Sig Dispense Refill   acetaminophen (TYLENOL) 325 MG tablet Take 650 mg by mouth every 6 (six) hours as needed for mild pain.     bictegravir-emtricitabine-tenofovir AF (BIKTARVY) 50-200-25 MG TABS tablet Take 1 tablet by mouth daily. 30 tablet 3   Cholecalciferol 1.25 MG (50000 UT) capsule Take 1 capsule by  mouth once a week.     escitalopram (LEXAPRO) 10 MG tablet Take 10 mg by mouth daily.     Insulin Pen Needle (UNIFINE PENTIPS) 32G X 4 MM MISC Use 1 pen needle once daily. 100 each 3   loratadine (CLARITIN) 10 MG tablet Take 1 tablet by mouth daily.     vitamin B-12 (CYANOCOBALAMIN) 500 MCG tablet Take by mouth.     dapagliflozin propanediol (FARXIGA) 10 MG TABS tablet Take 1 tablet (10 mg total) by mouth daily. 30 tablet 3   ELDERBERRY PO Take by mouth.     glipiZIDE (GLUCOTROL XL) 10 MG 24 hr tablet Take 1 tablet (10 mg total) by mouth daily. 30 tablet 3   liraglutide (VICTOZA) 18 MG/3ML SOPN Inject 0.6 mg into the skin daily. 3 mL 2   Pseudoeph-CPM-DM-APAP 30-2-15-325 MG TABS  Take 1 tablet by mouth every 8 (eight) hours as needed (cold symptoms). (Patient not taking: Reported on 06/24/2021)     No facility-administered medications prior to visit.     Past Medical History:  Diagnosis Date   Diabetes mellitus without complication (HCC)    HIV (human immunodeficiency virus infection) ([place])    Obesity      No past surgical history on file.     Review of Systems  Constitutional:  Positive for activity change (less motivated to do any activities outside of work), appetite change (increased), fatigue and unexpected weight change (gained weight). Negative for chills, diaphoresis and fever.  HENT:  Negative for congestion, dental problem, sinus pressure and sore throat.   Eyes:  Negative for photophobia and visual disturbance.  Respiratory:  Negative for cough, shortness of breath and wheezing.   Cardiovascular:  Negative for chest pain, palpitations and leg swelling.  Gastrointestinal:  Negative for anal bleeding, blood in stool, constipation, diarrhea, nausea, rectal pain and vomiting.  Endocrine: Positive for polydipsia and polyuria. Negative for polyphagia.  Genitourinary:  Positive for penile discharge. Negative for difficulty urinating, dysuria, flank pain, frequency, genital sores, scrotal swelling and testicular pain.  Musculoskeletal:  Negative for arthralgias, joint swelling and myalgias.  Skin:  Positive for rash (left inguinal region and beneath right chest).  Allergic/Immunologic: Negative for immunocompromised state.  Neurological:  Negative for weakness, light-headedness and headaches.  Hematological:  Negative for adenopathy.  Psychiatric/Behavioral:  Negative for agitation, behavioral problems, confusion, decreased concentration, dysphoric mood, hallucinations, self-injury, sleep disturbance and suicidal ideas. The patient is not nervous/anxious and is not hyperactive.       Objective:    BP 133/83   Pulse 86   Temp 98.3 F (36.8 C) (Oral)    Ht 6' 1"$  (1.854 m)   Wt 220 lb (99.8 kg)   BMI 29.03 kg/m  Nursing note and vital signs reviewed.  Physical Exam Vitals reviewed.  Constitutional:      General: He is not in acute distress.    Appearance: Normal appearance. He is not ill-appearing.  HENT:     Head: Normocephalic and atraumatic.     Mouth/Throat:     Mouth: Mucous membranes are moist.     Pharynx: Oropharynx is clear. No oropharyngeal exudate or posterior oropharyngeal erythema.  Eyes:     Extraocular Movements: Extraocular movements intact.     Conjunctiva/sclera: Conjunctivae normal.     Pupils: Pupils are equal, round, and reactive to light.  Cardiovascular:     Rate and Rhythm: Normal rate and regular rhythm.  Pulmonary:     Effort: Pulmonary effort is normal.  Breath sounds: Normal breath sounds.  Musculoskeletal:        General: Normal range of motion.     Cervical back: Normal range of motion and neck supple.  Skin:    Findings: Rash present.          Comments: Rough, raised hyperkeratotic patch, hyperpigmented skin, excoriations present, some satelite lesions outside of well demarcated border, no erythema, d/c or tenderness  Neurological:     General: No focal deficit present.     Mental Status: He is alert and oriented to person, place, and time.  Psychiatric:        Mood and Affect: Mood normal.        Behavior: Behavior normal.        Thought Content: Thought content normal.        Judgment: Judgment normal.     Comments: Flat affect, depressed mood, overall optimistic about future once he makes some lifestyle changes. He has good insights and is honest about what changes he needs to make.         02/27/2022    9:16 AM 06/24/2021   11:45 AM 06/20/2021   10:09 AM 01/30/2021   10:27 AM 05/14/2014    8:49 AM  Depression screen PHQ 2/9  Decreased Interest 0 1 3 0 0  Down, Depressed, Hopeless 1 1 3 $ 0 0  PHQ - 2 Score 1 2 6 $ 0 0  Altered sleeping   1    Tired, decreased energy   3    Change  in appetite   2    Feeling bad or failure about yourself    0    Trouble concentrating   3    Moving slowly or fidgety/restless   1    Suicidal thoughts   0    PHQ-9 Score   16    Difficult doing work/chores   Extremely dIfficult         Assessment & Plan:  HIV-1:  -pill box provided -alarm set on phone for reminders for all medications to improve adherency -Continue Biktarvy daily -Reviewed CD4, VL 06/20/21 STI screening -empircally treated for gonorrhea and chlamydia -Rocephin 500 mg IM -doxy 100 mg bid x 7d ays -declined condoms, encouraged harm reduction strategies -Screened today RPR, GC/C x 3 sites DM2 -refilled glipizide, victoza and farxiga x 1 month until he can follow up with Dr. Nino Glow office (provided number to contact today) -A1c 06/20/21 9.0 -needs glucometer  -dicusssed limiting carb intake and increasing activity -Symptomatic with polydipsia, urinary frequency, fatigue I anticipate A1c to be increased since June 2023 Tinea corporis -Most likely due to poorly controlled DM2 and working in warm environment at [employer] -clotrimazole cream  -Change shirts and underwar throughout the day, may consider diflucan orally if not improved -encouraged adherency to DM2 medications   Patient Active Problem List   Diagnosis Date Noted   Need for prophylactic vaccination against Streptococcus pneumoniae (pneumococcus) 06/24/2021   Syphilis 06/20/2021   Chlamydia 06/20/2021   Human immunodeficiency virus (HIV) disease ([place]) 01/30/2021   Encounter for long-term (current) use of high-risk medication 01/30/2021   Routine screening for STI (sexually transmitted infection) 01/30/2021   Type 2 diabetes mellitus without complications ([place]) 0000000     Problem List Items Addressed This Visit       Endocrine   Type 2 diabetes mellitus without complications ([place])   Relevant Medications   glipiZIDE (GLUCOTROL XL) 10 MG 24 hr tablet   liraglutide (VICTOZA) 18  MG/3ML  SOPN   dapagliflozin propanediol (FARXIGA) 10 MG TABS tablet   Other Relevant Orders   COMPLETE METABOLIC PANEL WITH GFR   Hemoglobin A1c     Other   Human immunodeficiency virus (HIV) disease ([place]) - Primary   Relevant Medications   clotrimazole (LOTRIMIN) 1 % cream   Other Relevant Orders   CBC with Differential/Platelet   COMPLETE METABOLIC PANEL WITH GFR   HIV-1 RNA quant-no reflex-bld   Lipid panel   T-helper cells (CD4) count (not at [facility])   Other Visit Diagnoses     Screening for venereal disease ([place])       Relevant Orders   GC/CT Probe, Amp (Throat)   RPR   C. trachomatis/N. gonorrhoeae RNA   CT/NG RNA, TMA Rectal   Tinea corporis       Relevant Medications   clotrimazole (LOTRIMIN) 1 % cream   cefTRIAXone (ROCEPHIN) injection 500 mg (Completed)   Gonorrhea       Relevant Medications   clotrimazole (LOTRIMIN) 1 % cream   cefTRIAXone (ROCEPHIN) injection 500 mg (Completed)        I have discontinued Ponciano Luzader's Pseudoeph-CPM-DM-APAP and ELDERBERRY PO. I am also having him start on clotrimazole and doxycycline. Additionally, I am having him maintain his acetaminophen, loratadine, escitalopram, Cholecalciferol, Unifine Pentips, cyanocobalamin, Biktarvy, glipiZIDE, liraglutide, and dapagliflozin propanediol. We administered cefTRIAXone.   Meds ordered this encounter  Medications   glipiZIDE (GLUCOTROL XL) 10 MG 24 hr tablet    Sig: Take 1 tablet (10 mg total) by mouth daily.    Dispense:  30 tablet    Refill:  0    IM program    Order Specific Question:   Supervising Provider    Answer:   VAN DAM, CORNELIUS N [[id number]]   liraglutide (VICTOZA) 18 MG/3ML SOPN    Sig: Inject 0.6 mg into the skin daily.    Dispense:  3 mL    Refill:  0    IM program    Order Specific Question:   Supervising Provider    Answer:   VAN DAM, CORNELIUS N [[id number]]   clotrimazole (LOTRIMIN) 1 % cream    Sig: Apply 1 Application topically 2 (two) times daily.    Dispense:  30 g     Refill:  0    Order Specific Question:   Supervising Provider    Answer:   VAN DAM, CORNELIUS N [[id number]]   dapagliflozin propanediol (FARXIGA) 10 MG TABS tablet    Sig: Take 1 tablet (10 mg total) by mouth daily.    Dispense:  30 tablet    Refill:  0    IM program    Order Specific Question:   Supervising Provider    Answer:   VAN DAM, CORNELIUS N [[id number]]   doxycycline (VIBRA-TABS) 100 MG tablet    Sig: Take 1 tablet (100 mg total) by mouth 2 (two) times daily for 7 days.    Dispense:  14 tablet    Refill:  0    Order Specific Question:   Supervising Provider    Answer:   VAN DAM, CORNELIUS N [[id number]]   cefTRIAXone (ROCEPHIN) injection 500 mg     Follow-up: Return in about 4 weeks (around 03/27/2022) for hiv.

## 2022-03-03 LAB — HEMOGLOBIN A1C
Hgb A1c MFr Bld: 9.7 % of total Hgb — ABNORMAL HIGH (ref <5.7)
Mean Plasma Glucose: 232 mg/dL
eAG (mmol/L): 12.8 mmol/L

## 2022-03-03 LAB — CBC WITH DIFFERENTIAL/PLATELET
Absolute Monocytes: 192 cells/uL — ABNORMAL LOW (ref 200–950)
Basophils Absolute: 12 cells/uL (ref 0–200)
Basophils Relative: 0.3 %
Eosinophils Absolute: 20 cells/uL (ref 15–500)
Eosinophils Relative: 0.5 %
HCT: 48.3 % (ref 38.5–50.0)
Hemoglobin: 16.6 g/dL (ref 13.2–17.1)
Lymphs Abs: 1112 cells/uL (ref 850–3900)
MCH: 29.9 pg (ref 27.0–33.0)
MCHC: 34.4 g/dL (ref 32.0–36.0)
MCV: 87 fL (ref 80.0–100.0)
MPV: 11.5 fL (ref 7.5–12.5)
Monocytes Relative: 4.8 %
Neutro Abs: 2664 cells/uL (ref 1500–7800)
Neutrophils Relative %: 66.6 %
Platelets: 175 10*3/uL (ref 140–400)
RBC: 5.55 10*6/uL (ref 4.20–5.80)
RDW: 12.2 % (ref 11.0–15.0)
Total Lymphocyte: 27.8 %
WBC: 4 10*3/uL (ref 3.8–10.8)

## 2022-03-03 LAB — COMPLETE METABOLIC PANEL WITH GFR
AG Ratio: 1.6 (calc) (ref 1.0–2.5)
ALT: 19 U/L (ref 9–46)
AST: 12 U/L (ref 10–40)
Albumin: 4.3 g/dL (ref 3.6–5.1)
Alkaline phosphatase (APISO): 58 U/L (ref 36–130)
BUN: 15 mg/dL (ref 7–25)
CO2: 26 mmol/L (ref 20–32)
Calcium: 9.2 mg/dL (ref 8.6–10.3)
Chloride: 102 mmol/L (ref 98–110)
Creat: 0.96 mg/dL (ref 0.60–1.24)
Globulin: 2.7 g/dL (calc) (ref 1.9–3.7)
Glucose, Bld: 327 mg/dL — ABNORMAL HIGH (ref 65–99)
Potassium: 4.2 mmol/L (ref 3.5–5.3)
Sodium: 138 mmol/L (ref 135–146)
Total Bilirubin: 1 mg/dL (ref 0.2–1.2)
Total Protein: 7 g/dL (ref 6.1–8.1)
eGFR: 113 mL/min/{1.73_m2} (ref >=60)

## 2022-03-03 LAB — LIPID PANEL
Cholesterol: 179 mg/dL (ref <200)
HDL: 62 mg/dL (ref >=40)
LDL Cholesterol (Calc): 103 mg/dL (calc) — ABNORMAL HIGH
Non-HDL Cholesterol (Calc): 117 mg/dL (calc) (ref <130)
Total CHOL/HDL Ratio: 2.9 (calc) (ref <5.0)
Triglycerides: 46 mg/dL (ref <150)

## 2022-03-03 LAB — T-HELPER CELLS (CD4) COUNT (NOT AT ARMC)
Absolute CD4: 346 cells/uL — ABNORMAL LOW (ref 490–1740)
CD4 T Helper %: 32 % (ref 30–61)
Total lymphocyte count: 1080 cells/uL (ref 850–3900)

## 2022-03-03 LAB — C. TRACHOMATIS/N. GONORRHOEAE RNA
C. trachomatis RNA, TMA: NOT DETECTED
N. gonorrhoeae RNA, TMA: DETECTED — AB

## 2022-03-03 LAB — GC/CHLAMYDIA PROBE, AMP (THROAT)
Chlamydia trachomatis RNA: NOT DETECTED
Neisseria gonorrhoeae RNA: DETECTED — AB

## 2022-03-03 LAB — HIV-1 RNA QUANT-NO REFLEX-BLD
HIV 1 RNA Quant: <20 Copies/mL — ABNORMAL HIGH
HIV-1 RNA Quant, Log: <1.3 Log cps/mL — ABNORMAL HIGH

## 2022-03-03 LAB — RPR: RPR Ser Ql: REACTIVE — AB

## 2022-03-03 LAB — CT/NG RNA, TMA RECTAL
Chlamydia Trachomatis RNA: NOT DETECTED
Neisseria Gonorrhoeae RNA: DETECTED — AB

## 2022-03-03 LAB — T PALLIDUM AB: T Pallidum Abs: POSITIVE — AB

## 2022-03-03 LAB — RPR TITER: RPR Titer: 1:1 {titer} — ABNORMAL HIGH

## 2022-03-06 ENCOUNTER — Ambulatory Visit: Payer: Self-pay | Admitting: Physician Assistant

## 2022-03-09 ENCOUNTER — Telehealth: Payer: Self-pay

## 2022-03-09 NOTE — Telephone Encounter (Signed)
-----   Message from Robert Bellow, [place] sent at 03/09/2022  2:57 PM EST ----- Please notify Darryl Brown that his viral load was undetectable and CD4 was 346, which is great.  He was positive for oral gonorrhea, rectal gonorrhea. You received treatment while in clinic.  Remember to avoid sexual contact until 7 days after treatment. No signs of reinfection for syphilis.We need to schedule gonorrhea cure visit in 3 months. Cholesterol was within normal limits, but your blood sugar was 327 and A1c was 9.7.  We knew this would be elevated and I know you are connecting with Dr. Soyla Murphy for follow up.  Please take your medications as we discussed. If you need anything between now and next visit, please reach out.

## 2022-03-09 NOTE — Telephone Encounter (Signed)
Patient aware of results and seen everything on my chart. Patient has appointment with Internal medicine on 03/13/2022.   [place], CMA

## 2022-03-10 ENCOUNTER — Encounter (HOSPITAL_COMMUNITY): Payer: Self-pay | Admitting: Emergency Medicine

## 2022-03-10 ENCOUNTER — Ambulatory Visit (INDEPENDENT_AMBULATORY_CARE_PROVIDER_SITE_OTHER): Payer: Self-pay

## 2022-03-10 ENCOUNTER — Ambulatory Visit (HOSPITAL_COMMUNITY)
Admission: EM | Admit: 2022-03-10 | Discharge: 2022-03-10 | Disposition: A | Discharge status: Home / Self Care | Payer: Self-pay | Attending: Physician Assistant | Admitting: Physician Assistant

## 2022-03-10 DIAGNOSIS — M5441 Lumbago with sciatica, right side: Secondary | ICD-10-CM

## 2022-03-10 DIAGNOSIS — M545 Low back pain, unspecified: Secondary | ICD-10-CM

## 2022-03-10 DIAGNOSIS — S39012A Strain of muscle, fascia and tendon of lower back, initial encounter: Secondary | ICD-10-CM

## 2022-03-10 DIAGNOSIS — M5442 Lumbago with sciatica, left side: Secondary | ICD-10-CM

## 2022-03-10 MED ORDER — LIDOCAINE 5 % EX PTCH: 1.0000 | MEDICATED_PATCH | CUTANEOUS | 0 refills | Status: AC

## 2022-03-10 MED ORDER — METHOCARBAMOL 500 MG PO TABS: 500.0000 mg | ORAL_TABLET | Freq: Three times a day (TID) | ORAL | 0 refills | Status: DC | PRN
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
## 2022-03-10 NOTE — Discharge Instructions (Signed)
Your x-ray was normal with no evidence of fracture or malalignment.  This is great news.  I suspect that you injured some of the muscles in your lower back which are putting pressure on the nerves causing your pain.  Please use heat and gentle stretch for symptom relief.  Use lidocaine patch on specific areas.  Take Robaxin up to 3 times a day.  This make you sleepy so do not drive or drink alcohol with taking it.  I think you would benefit from physical therapy and so recommend that you follow-up with sports medicine as they can help arrange this.  If you have any worsening symptoms including increasing pain, difficulty walking, weakness in your lower legs, numbness or tingling on the inside of your legs, going to the bathroom on yourself without noticing it.
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
## 2022-03-13 ENCOUNTER — Encounter: Payer: Self-pay | Admitting: Student

## 2022-04-01 ENCOUNTER — Ambulatory Visit (HOSPITAL_COMMUNITY)
Admission: EM | Admit: 2022-04-01 | Discharge: 2022-04-01 | Disposition: A | Discharge status: Home / Self Care | Payer: Self-pay | Attending: Physician Assistant | Admitting: Physician Assistant

## 2022-04-01 ENCOUNTER — Other Ambulatory Visit: Payer: Self-pay

## 2022-04-01 ENCOUNTER — Emergency Department (HOSPITAL_COMMUNITY)
Admission: EM | Admit: 2022-04-01 | Discharge: 2022-04-01 | Disposition: A | Discharge status: Home / Self Care | Payer: Self-pay | Attending: Emergency Medicine | Admitting: Emergency Medicine

## 2022-04-01 ENCOUNTER — Encounter (HOSPITAL_COMMUNITY): Payer: Self-pay

## 2022-04-01 DIAGNOSIS — K6289 Other specified diseases of anus and rectum: Secondary | ICD-10-CM | POA: Insufficient documentation

## 2022-04-01 DIAGNOSIS — K614 Intrasphincteric abscess: Secondary | ICD-10-CM | POA: Insufficient documentation

## 2022-04-01 DIAGNOSIS — Z21 Asymptomatic human immunodeficiency virus [HIV] infection status: Secondary | ICD-10-CM | POA: Insufficient documentation

## 2022-04-01 DIAGNOSIS — B20 Human immunodeficiency virus [HIV] disease: Secondary | ICD-10-CM

## 2022-04-01 MED ORDER — AMOXICILLIN-POT CLAVULANATE 875-125 MG PO TABS: 1.0000 | ORAL_TABLET | Freq: Two times a day (BID) | ORAL | 0 refills | Status: DC

## 2022-04-01 MED ORDER — HYDROCODONE-ACETAMINOPHEN 5-325 MG PO TABS: 0.5000 | ORAL_TABLET | Freq: Two times a day (BID) | ORAL | 0 refills | Status: AC

## 2022-04-01 NOTE — ED Triage Notes (Signed)
Pt arrived via POV. Pt c/o rectal pain and swelling for past several days. Pt states they had small amt red mucous during BM this AM.  AOx4

## 2022-04-01 NOTE — ED Provider Notes (Signed)
[facility]    CSN: [id number] Arrival date & time: 04/01/22  [id number]      History   Chief Complaint Chief Complaint  Patient presents with   Rectal Pain    HPI Darryl Brown is a 27 y.o. male.   Patient presents today with a 5-day history of rectal pain.  He reports that pain is rated 4 at rest but increases to 8/9 when he attempts to pass a bowel movement, described as sharp, no alleviating factors identified.  He has tried Preparation H, smooth move tea, hemorrhoid suppositories without improvement of symptoms.  He is able to pass a bowel movement reports his last bowel movement was earlier today but is painful and smaller than normal.  He denies any urinary symptoms including frequency, urgency, hematuria.  He denies any fever, nausea, vomiting, melena, hematochezia.  He does participate in receptive anal intercourse but has not been sexually active recently.  He does have a history of HIV and last blood work obtained 02/27/2022 showed low absolute CD4 cell count undetectable viral replication.  Denies any recent antibiotics or steroids.  Denies history of perianal abscess.  Denies history of Crohn's disease or ulcerative colitis.    Past Medical History:  Diagnosis Date   Diabetes mellitus without complication ([place])    HIV (human immunodeficiency virus infection) ([place])    Obesity     Patient Active Problem List   Diagnosis Date Noted   Need for prophylactic vaccination against Streptococcus pneumoniae (pneumococcus) 06/24/2021   Syphilis 06/20/2021   Chlamydia 06/20/2021   Human immunodeficiency virus (HIV) disease ([place]) 01/30/2021   Type 2 diabetes mellitus without complications ([place]) 0000000    History reviewed. No pertinent surgical history.     Home Medications    Prior to Admission medications   Medication Sig Start Date End Date Taking? Authorizing Provider  amoxicillin-clavulanate (AUGMENTIN) 875-125 MG tablet Take 1 tablet by mouth every 12  (twelve) hours. 04/01/22  Yes Novelle Addair K, PA-C  bictegravir-emtricitabine-tenofovir AF (BIKTARVY) 50-200-25 MG TABS tablet Take 1 tablet by mouth daily. 08/13/21  Yes Kuppelweiser, Cassie L, RPH-CPP  Cholecalciferol 1.25 MG (50000 UT) capsule Take 1 capsule by mouth once a week. 06/19/20  Yes [provider]  clotrimazole (LOTRIMIN) 1 % cream Apply 1 Application topically 2 (two) times daily. 02/27/22  Yes Silvio Pate R, PA-C  dapagliflozin propanediol (FARXIGA) 10 MG TABS tablet Take 1 tablet (10 mg total) by mouth daily. 02/27/22  Yes Robert Bellow, PA-C  HYDROcodone-acetaminophen (NORCO/VICODIN) 5-325 MG tablet Take 0.5-1 tablets by mouth in the morning and at bedtime for 2 days. 04/01/22 04/03/22 Yes Abisola Carrero K, PA-C  Insulin Pen Needle (UNIFINE PENTIPS) 32G X 4 MM MISC Use 1 pen needle once daily. 06/20/21  Yes Virl Axe, MD  lidocaine (LIDODERM) 5 % Place 1 patch onto the skin daily. Remove & Discard patch within 12 hours or as directed by MD 03/10/22  Yes Tarri Guilfoil K, PA-C  liraglutide (VICTOZA) 18 MG/3ML SOPN Inject 0.6 mg into the skin daily. 02/27/22  Yes Silvio Pate R, PA-C  loratadine (CLARITIN) 10 MG tablet Take 1 tablet by mouth daily. 12/31/20  Yes [provider]  methocarbamol (ROBAXIN) 500 MG tablet Take 1 tablet (500 mg total) by mouth every 8 (eight) hours as needed for muscle spasms. 03/10/22  Yes Batina Dougan K, PA-C  vitamin B-12 (CYANOCOBALAMIN) 500 MCG tablet Take by mouth. 12/31/20  Yes [provider]  acetaminophen (TYLENOL) 325 MG tablet Take  650 mg by mouth every 6 (six) hours as needed for mild pain.    [provider]  escitalopram (LEXAPRO) 10 MG tablet Take 10 mg by mouth daily. 01/03/21   [provider]  glipiZIDE (GLUCOTROL XL) 10 MG 24 hr tablet Take 1 tablet (10 mg total) by mouth daily. 02/27/22   Robert Bellow, PA-C    Family History History reviewed. No pertinent family history.  Social  History Social History   Tobacco Use   Smoking status: Every Day    Types: Cigarettes   Smokeless tobacco: Never   Tobacco comments:    Black and Milds 2-3 per day  Vaping Use   Vaping Use: Every day  Substance Use Topics   Alcohol use: Yes    Comment: Pt states about 3 beers a week   Drug use: Yes    Types: Marijuana     Allergies   Metformin and Sertraline   Review of Systems Review of Systems  Constitutional:  Positive for activity change. Negative for appetite change, fatigue and fever.  Gastrointestinal:  Positive for rectal pain. Negative for abdominal pain, anal bleeding, blood in stool, constipation, diarrhea, nausea and vomiting.  Genitourinary:  Negative for dysuria, frequency, genital sores, hematuria and urgency.  Musculoskeletal:  Negative for arthralgias and myalgias.     Physical Exam Triage Vital Signs ED Triage Vitals  Enc Vitals Group     BP 04/01/22 0953 126/78     Pulse Rate 04/01/22 0953 92     Resp 04/01/22 0953 12     Temp 04/01/22 0953 98.1 F (36.7 C)     Temp Source 04/01/22 0953 Oral     SpO2 04/01/22 0953 96 %     Weight --      Height --      Head Circumference --      Peak Flow --      Pain Score 04/01/22 0950 6     Pain Loc --      Pain Edu? --      Excl. in [place]? --    No data found.  Updated Vital Signs BP 126/78 (BP Location: Left Arm)   Pulse 92   Temp 98.1 F (36.7 C) (Oral)   Resp 12   SpO2 96%   Visual Acuity Right Eye Distance:   Left Eye Distance:   Bilateral Distance:    Right Eye Near:   Left Eye Near:    Bilateral Near:     Physical Exam Vitals reviewed.  Constitutional:      General: He is awake.     Appearance: Normal appearance. He is well-developed. He is not ill-appearing.     Comments: Very pleasant male appears stated age no acute distress sitting comfortably in exam room  HENT:     Head: Normocephalic and atraumatic.  Cardiovascular:     Rate and Rhythm: Normal rate and regular rhythm.      Heart sounds: Normal heart sounds, S1 normal and S2 normal. No murmur heard. Pulmonary:     Effort: Pulmonary effort is normal.     Breath sounds: Normal breath sounds. No stridor. No wheezing, rhonchi or rales.     Comments: Clear to auscultation bilaterally Abdominal:     General: Bowel sounds are normal.     Palpations: Abdomen is soft.     Tenderness: There is no abdominal tenderness. There is no right CVA tenderness, left CVA tenderness, guarding or rebound.  Genitourinary:    Prostate:  Normal.     Rectum: Mass and tenderness present.       Comments: Tender fluctuant mass noted.  No hemorrhoid noted.  No fissure on exam.  Luellen Pucker, RN present as chaperone during exam. Neurological:     Mental Status: He is alert.  Psychiatric:        Behavior: Behavior is cooperative.      UC Treatments / Results  Labs (all labs ordered are listed, but only abnormal results are displayed) Labs Reviewed  CYTOLOGY, (ORAL, ANAL, URETHRAL) ANCILLARY ONLY    EKG   Radiology No results found.  Procedures Procedures (including critical care time)  Medications Ordered in UC Medications - No data to display  Initial Impression / Assessment and Plan / UC Course  I have reviewed the triage vital signs and the nursing notes.  Pertinent labs & imaging results that were available during my care of the patient were reviewed by me and considered in my medical decision making (see chart for details).     Concern for intersphincteric abscess given clinical presentation.  Unfortunately, we do not have acute believes of draining this in urgent care.  STI swab was collected from anal region and is pending.  Will start Augmentin twice daily for 7 days but discussed that he would need to follow-up with general surgery as soon as possible.  He was given contact information for local provider with instruction to call to schedule an appointment.  You can use Tylenol for pain.  He is unable to take NSAIDs  given antiretroviral therapy so was given a few doses of hydrocodone to help with pain.  Discussed sedating and addictive so she should limit use is much as possible.  We also discussed that this can make him more constipated so is important that he increase his fiber and considers bowel regimen such as MiraLAX to ensure he continues to pass stool.  We discussed that if at any point he has worsening symptoms including increasing pain, fever, nausea, vomiting, difficulty passing stool you must go to the emergency room immediately.  Strict return precautions given.  Work excuse note provided.  Final Clinical Impressions(s) / UC Diagnoses   Final diagnoses:  Intersphincteric abscess  Anal or rectal pain     Discharge Instructions      I am concerned that you have an abscess.  Please follow-up with general surgery as soon as possible.  Call them to schedule an appointment as soon as you leave here.  Start Augmentin twice daily for 7 days.  I have called in some hydrocodone to help with your pain.  This can be sedating and it is addictive so please try to use this as infrequently as possible.  This can also make you constipated so if you are going to use it is important that you drink lots of fluid, increase fiber, use MiraLAX to ensure that you are able to pass bowel movements.  If your symptoms worsen in any way and you have increasing pain, fever, nausea/vomiting interfering with oral intake, difficulty passing stool you must go to the emergency room immediately.     ED Prescriptions     Medication Sig Dispense Auth. Provider   amoxicillin-clavulanate (AUGMENTIN) 875-125 MG tablet Take 1 tablet by mouth every 12 (twelve) hours. 14 tablet Chandni Gagan K, PA-C   HYDROcodone-acetaminophen (NORCO/VICODIN) 5-325 MG tablet Take 0.5-1 tablets by mouth in the morning and at bedtime for 2 days. 4 tablet Esias Mory, Derry Skill, PA-C  I have reviewed the PDMP during this encounter.   Terrilee Croak,  PA-C 04/01/22 1034

## 2022-04-01 NOTE — ED Provider Notes (Signed)
[place] EMERGENCY DEPARTMENT AT [facility] Provider Note   CSN: [id number] Arrival date & time: 04/01/22  1234     History  Chief Complaint  Patient presents with   Rectal Pain    Darryl Brown is a 27 y.o. male.  HPI Patient is a 27 year old male with past medical history significant for HIV followed by infectious disease as an appointment next month  Patient states that he has had 5 days of rectal pain.  He states that the pain is located just around his anus.  He states that he was seen in urgent care earlier today and had exam was started on Augmentin and referred to general surgery for possible abscess.  He states he went home and had a painful bowel movements with a small amount of blood.  He states that he is now feeling much improved not having any pain.  But he was told his discharge instructions to go to emergency room if he had any rectal bleeding.  Denies any fevers chills nausea or vomiting.  No pain currently.  No abdominal pain lightheadedness dizziness.  No history of diabetes.     Home Medications Prior to Admission medications   Medication Sig Start Date End Date Taking? Authorizing Provider  acetaminophen (TYLENOL) 325 MG tablet Take 650 mg by mouth every 6 (six) hours as needed for mild pain.    [provider]  amoxicillin-clavulanate (AUGMENTIN) 875-125 MG tablet Take 1 tablet by mouth every 12 (twelve) hours. 04/01/22   Raspet, Derry Skill, PA-C  bictegravir-emtricitabine-tenofovir AF (BIKTARVY) 50-200-25 MG TABS tablet Take 1 tablet by mouth daily. 08/13/21   Kuppelweiser, Cassie L, RPH-CPP  Cholecalciferol 1.25 MG (50000 UT) capsule Take 1 capsule by mouth once a week. 06/19/20   [provider]  clotrimazole (LOTRIMIN) 1 % cream Apply 1 Application topically 2 (two) times daily. 02/27/22   Robert Bellow, PA-C  dapagliflozin propanediol (FARXIGA) 10 MG TABS tablet Take 1 tablet (10 mg total) by mouth daily. 02/27/22   Robert Bellow, PA-C  escitalopram (LEXAPRO) 10 MG tablet Take 10 mg by mouth daily. 01/03/21   [provider]  glipiZIDE (GLUCOTROL XL) 10 MG 24 hr tablet Take 1 tablet (10 mg total) by mouth daily. 02/27/22   Robert Bellow, PA-C  HYDROcodone-acetaminophen (NORCO/VICODIN) 5-325 MG tablet Take 0.5-1 tablets by mouth in the morning and at bedtime for 2 days. 04/01/22 04/03/22  Raspet, Derry Skill, PA-C  Insulin Pen Needle (UNIFINE PENTIPS) 32G X 4 MM MISC Use 1 pen needle once daily. 06/20/21   Virl Axe, MD  lidocaine (LIDODERM) 5 % Place 1 patch onto the skin daily. Remove & Discard patch within 12 hours or as directed by MD 03/10/22   Raspet, Junie Panning K, PA-C  liraglutide (VICTOZA) 18 MG/3ML SOPN Inject 0.6 mg into the skin daily. 02/27/22   Robert Bellow, PA-C  loratadine (CLARITIN) 10 MG tablet Take 1 tablet by mouth daily. 12/31/20   [provider]  methocarbamol (ROBAXIN) 500 MG tablet Take 1 tablet (500 mg total) by mouth every 8 (eight) hours as needed for muscle spasms. 03/10/22   Raspet, Derry Skill, PA-C  vitamin B-12 (CYANOCOBALAMIN) 500 MCG tablet Take by mouth. 12/31/20   [provider]      Allergies    Metformin and Sertraline    Review of Systems   Review of Systems  Physical Exam Updated Vital Signs BP 138/86   Pulse 98   Temp 98 F (  36.7 C)   Resp 16   SpO2 100%  Physical Exam Vitals and nursing note reviewed.  Constitutional:      General: He is not in acute distress.    Appearance: Normal appearance. He is not ill-appearing.  HENT:     Head: Normocephalic and atraumatic.  Eyes:     General: No scleral icterus.       Right eye: No discharge.        Left eye: No discharge.     Conjunctiva/sclera: Conjunctivae normal.  Pulmonary:     Effort: Pulmonary effort is normal.     Breath sounds: No stridor.  Genitourinary:    Comments: I appreciate any palpable masses.  No external hemorrhoids evident on exam. Neurological:     Mental Status: He is  alert and oriented to person, place, and time. Mental status is at baseline.    ED Results / Procedures / Treatments   Labs (all labs ordered are listed, but only abnormal results are displayed) Labs Reviewed - No data to display  EKG None  Radiology No results found.  Procedures Procedures    Medications Ordered in ED Medications - No data to display  ED Course/ Medical Decision Making/ A&P                             Medical Decision Making  Patient is a 27 year old male with past medical history significant for HIV followed by infectious disease as an appointment next month  Patient states that he has had 5 days of rectal pain.  He states that the pain is located just around his anus.  He states that he was seen in urgent care earlier today and had exam was started on Augmentin and referred to general surgery for possible abscess.  He states he went home and had a painful bowel movements with a small amount of blood.  He states that he is now feeling much improved not having any pain.  But he was told his discharge instructions to go to emergency room if he had any rectal bleeding.  Denies any fevers chills nausea or vomiting.  No pain currently.  No abdominal pain lightheadedness dizziness.  No history of diabetes.   Normal vital signs, well-appearing and without any current symptoms.  External rectal exam without any significant abnormal finding.  I do not appreciate any perirectal abscess.  He will continue with prior plan. I do not feel that emergent surgical consultation is warranted at this time and I do not actually appreciate much swelling here at his anus.  He is also symptom-free.  I provided him with strict return precautions and instructions on sitz bath's and congestion with his Augmentin he was already prescribed.  I cautioned him against using the hydrocodone as this can worsen constipation and rectal discomfort.  Return precautions discussed.  He will  follow-up with his infectious disease doctor next month as scheduled and follow-up with general surgery.  Final Clinical Impression(s) / ED Diagnoses Final diagnoses:  Rectal pain    Rx / DC Orders ED Discharge Orders     None         Tedd Sias, [place] 04/01/22 1524    Wyvonnia Dusky, MD 04/01/22 [id number]

## 2022-04-01 NOTE — ED Triage Notes (Signed)
Pt is her for rectal pain, pt has tried Preparation H , Smooth move tea but, nothing was helping .

## 2022-04-01 NOTE — Discharge Instructions (Signed)
Make sure you are hydrating well, alternate Tylenol and ibuprofen for pain as needed.  I have given you the maximum doses below but you can titrate this to your work discomfort.  I have printed out some information for you about sitz bath's please do this regularly at least twice a day.  Return to emergency room for any new or concerning symptoms otherwise please take the antibiotics you are prescribed, continue using the topical medicines/creams and follow-up with general surgery.

## 2022-04-01 NOTE — Discharge Instructions (Signed)
I am concerned that you have an abscess.  Please follow-up with general surgery as soon as possible.  Call them to schedule an appointment as soon as you leave here.  Start Augmentin twice daily for 7 days.  I have called in some hydrocodone to help with your pain.  This can be sedating and it is addictive so please try to use this as infrequently as possible.  This can also make you constipated so if you are going to use it is important that you drink lots of fluid, increase fiber, use MiraLAX to ensure that you are able to pass bowel movements.  If your symptoms worsen in any way and you have increasing pain, fever, nausea/vomiting interfering with oral intake, difficulty passing stool you must go to the emergency room immediately.

## 2022-04-02 LAB — CYTOLOGY, (ORAL, ANAL, URETHRAL) ANCILLARY ONLY
Chlamydia: POSITIVE — AB
Comment: NEGATIVE
Comment: NEGATIVE
Comment: NORMAL
Neisseria Gonorrhea: NEGATIVE
Trichomonas: NEGATIVE

## 2022-04-03 ENCOUNTER — Telehealth (HOSPITAL_COMMUNITY): Payer: Self-pay | Admitting: Emergency Medicine

## 2022-04-03 MED ORDER — DOXYCYCLINE HYCLATE 100 MG PO CAPS: 100.0000 mg | ORAL_CAPSULE | Freq: Two times a day (BID) | ORAL | 0 refills | Status: AC

## 2022-05-18 ENCOUNTER — Ambulatory Visit: Payer: Self-pay | Admitting: Physician Assistant

## 2022-06-19 ENCOUNTER — Other Ambulatory Visit: Payer: Self-pay | Admitting: Pharmacist

## 2022-06-19 DIAGNOSIS — B20 Human immunodeficiency virus [HIV] disease: Secondary | ICD-10-CM

## 2022-06-22 ENCOUNTER — Encounter: Payer: Self-pay | Admitting: *Deleted

## 2022-07-14 ENCOUNTER — Emergency Department (HOSPITAL_COMMUNITY): Payer: Self-pay

## 2022-07-14 ENCOUNTER — Emergency Department (HOSPITAL_COMMUNITY)
Admission: EM | Admit: 2022-07-14 | Discharge: 2022-07-14 | Discharge status: Against Medical Advice | Payer: Self-pay | Attending: Emergency Medicine | Admitting: Emergency Medicine

## 2022-07-14 DIAGNOSIS — E119 Type 2 diabetes mellitus without complications: Secondary | ICD-10-CM | POA: Insufficient documentation

## 2022-07-14 DIAGNOSIS — R109 Unspecified abdominal pain: Secondary | ICD-10-CM | POA: Insufficient documentation

## 2022-07-14 DIAGNOSIS — R161 Splenomegaly, not elsewhere classified: Secondary | ICD-10-CM

## 2022-07-14 DIAGNOSIS — Z5321 Procedure and treatment not carried out due to patient leaving prior to being seen by health care provider: Secondary | ICD-10-CM

## 2022-07-14 DIAGNOSIS — Z5329 Procedure and treatment not carried out because of patient's decision for other reasons: Secondary | ICD-10-CM | POA: Insufficient documentation

## 2022-07-14 DIAGNOSIS — B20 Human immunodeficiency virus [HIV] disease: Secondary | ICD-10-CM | POA: Insufficient documentation

## 2022-07-14 LAB — URINALYSIS, ROUTINE W REFLEX MICROSCOPIC
Bacteria, UA: NONE SEEN
Bilirubin Urine: NEGATIVE
Glucose, UA: >=500 mg/dL — AB
Hgb urine dipstick: NEGATIVE
Ketones, ur: NEGATIVE mg/dL
Leukocytes,Ua: NEGATIVE
Nitrite: NEGATIVE
Protein, ur: NEGATIVE mg/dL
Specific Gravity, Urine: 1.011 (ref 1.005–1.030)
pH: 7 (ref 5.0–8.0)

## 2022-07-14 LAB — CBC
HCT: 44.5 % (ref 39.0–52.0)
Hemoglobin: 14.8 g/dL (ref 13.0–17.0)
MCH: 29 pg (ref 26.0–34.0)
MCHC: 33.3 g/dL (ref 30.0–36.0)
MCV: 87.1 fL (ref 80.0–100.0)
Platelets: 192 10*3/uL (ref 150–400)
RBC: 5.11 MIL/uL (ref 4.22–5.81)
RDW: 12.9 % (ref 11.5–15.5)
WBC: 5.5 10*3/uL (ref 4.0–10.5)
nRBC: 0 % (ref 0.0–0.2)

## 2022-07-14 LAB — COMPREHENSIVE METABOLIC PANEL
ALT: 20 U/L (ref 0–44)
AST: 16 U/L (ref 15–41)
Albumin: 3.9 g/dL (ref 3.5–5.0)
Alkaline Phosphatase: 52 U/L (ref 38–126)
Anion gap: 7 (ref 5–15)
BUN: 9 mg/dL (ref 6–20)
CO2: 27 mmol/L (ref 22–32)
Calcium: 8.8 mg/dL — ABNORMAL LOW (ref 8.9–10.3)
Chloride: 103 mmol/L (ref 98–111)
Creatinine, Ser: 0.88 mg/dL (ref 0.61–1.24)
GFR, Estimated: >60 mL/min (ref >60)
Glucose, Bld: 264 mg/dL — ABNORMAL HIGH (ref 70–99)
Potassium: 3.8 mmol/L (ref 3.5–5.1)
Sodium: 137 mmol/L (ref 135–145)
Total Bilirubin: 0.7 mg/dL (ref 0.3–1.2)
Total Protein: 7.1 g/dL (ref 6.5–8.1)

## 2022-07-14 LAB — LIPASE, BLOOD: Lipase: 42 U/L (ref 11–51)

## 2022-07-14 LAB — CBG MONITORING, ED: Glucose-Capillary: 194 mg/dL — ABNORMAL HIGH (ref 70–99)

## 2022-07-14 MED ORDER — LACTATED RINGERS IV BOLUS
1000.0000 mL | Freq: Once | INTRAVENOUS | Status: AC
  Administered 2022-07-14: 1000 mL via INTRAVENOUS

## 2022-07-14 MED ORDER — PANTOPRAZOLE SODIUM 40 MG IV SOLR
40.0000 mg | Freq: Once | INTRAVENOUS | Status: AC
  Administered 2022-07-14: 40 mg via INTRAVENOUS
  Filled 2022-07-14: qty 10

## 2022-07-14 MED ORDER — IOHEXOL 300 MG/ML  SOLN
100.0000 mL | Freq: Once | INTRAMUSCULAR | Status: AC | PRN
  Administered 2022-07-14: 100 mL via INTRAVENOUS

## 2022-07-14 MED ORDER — ONDANSETRON HCL 4 MG/2ML IJ SOLN
4.0000 mg | Freq: Once | INTRAMUSCULAR | Status: AC
  Administered 2022-07-14: 4 mg via INTRAVENOUS
  Filled 2022-07-14: qty 2

## 2022-07-14 NOTE — ED Notes (Signed)
CBG 252. 

## 2022-07-14 NOTE — ED Notes (Signed)
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

## 2022-07-14 NOTE — ED Triage Notes (Signed)
Patient here from home reporting n/v for a couple of day.s

## 2022-07-14 NOTE — ED Provider Notes (Incomplete)
[place] [facility]MENT AT [facility] Provider Note   CSN: [id number] Arrival date & time: 07/14/22  1320     History Chief Complaint  Patient presents with  . Nausea  . Emesis    Darryl Brown is a 27 y.o. male with history of type 2 diabetes, HIV presents emerged from today for evaluation of nausea, vomiting, and diffuse abdominal cramping.  He reports that it all started around 1 week ago but has been worsening since Saturday.  Reports has been having some softer stools but denies any diarrhea.  Denies any melena or hematochezia.  Denies any chest pain, shortness of breath, fever, chills, dysuria, hematuria, or any testicular or penile complaints.  Denies any rectal pain or any tenesmus.  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  He has not tried any medication for his symptoms.  He reports he is able to eat some foods and not able to eat others.  He reports that he had some greenish stool yesterday after the salad he ate.  He reports that he threw up some hashbrowns that he ate this morning but was able to tolerate some other foods.  He is a daily marijuana user.   Emesis Associated symptoms: abdominal pain   Associated symptoms: no chills, no diarrhea and no fever        Home Medications Prior to Admission medications   Medication Sig Start Date End Date Taking? Authorizing Provider  acetaminophen (TYLENOL) 325 MG tablet Take 650 mg by mouth every 6 (six) hours as needed for mild pain.    [provider]  bictegravir-emtricitabine-tenofovir AF (BIKTARVY) 50-200-25 MG TABS tablet TAKE 1 TABLET BY MOUTH DAILY 06/19/22   Horton Finer, PA-C  Cholecalciferol 1.25 MG (50000 UT) capsule Take 1 capsule by mouth once a week. 06/19/20   [provider]  clotrimazole (LOTRIMIN) 1 % cream Apply 1 Application topically 2 (two) times daily. 02/27/22   Horton Finer, PA-C  dapagliflozin propanediol (FARXIGA) 10 MG TABS  tablet Take 1 tablet (10 mg total) by mouth daily. 02/27/22   Horton Finer, PA-C  escitalopram (LEXAPRO) 10 MG tablet Take 10 mg by mouth daily. 01/03/21   [provider]  glipiZIDE (GLUCOTROL XL) 10 MG 24 hr tablet Take 1 tablet (10 mg total) by mouth daily. 02/27/22   Horton Finer, PA-C  Insulin Pen Needle (UNIFINE PENTIPS) 32G X 4 MM MISC Use 1 pen needle once daily. 06/20/21   Merrilyn Puma, MD  lidocaine (LIDODERM) 5 % Place 1 patch onto the skin daily. Remove & Discard patch within 12 hours or as directed by MD 03/10/22   Raspet, Denny Peon K, PA-C  liraglutide (VICTOZA) 18 MG/3ML SOPN Inject 0.6 mg into the skin daily. 02/27/22   Horton Finer, PA-C  loratadine (CLARITIN) 10 MG tablet Take 1 tablet by mouth daily. 12/31/20   [provider]  methocarbamol (ROBAXIN) 500 MG tablet Take 1 tablet (500 mg total) by mouth every 8 (eight) hours as needed for muscle spasms. 03/10/22   Raspet, Noberto Retort, PA-C  vitamin B-12 (CYANOCOBALAMIN) 500 MCG tablet Take by mouth. 12/31/20   [provider]      Allergies    Metformin and Sertraline    Review of Systems   Review of Systems  Constitutional:  Negative for chills and fever.  Respiratory:  Negative for shortness of breath.   Cardiovascular:  Negative for chest pain.  Gastrointestinal:  Positive for abdominal pain, nausea and vomiting. Negative for anal bleeding, blood in stool, constipation, diarrhea and rectal pain.  Genitourinary:  Negative for dysuria, hematuria, penile discharge, penile pain, penile swelling, scrotal swelling and testicular pain.  Musculoskeletal:  Negative for joint swelling.    Physical Exam Updated Vital Signs BP (!) 146/95 (BP Location: Left Arm)   Pulse 65   Temp 98.2 F (36.8 C) (Oral)   Resp 17   SpO2 100%  Physical Exam Vitals and nursing note reviewed.  Constitutional:      General: He is not in acute distress.    Appearance: He is not ill-appearing or toxic-appearing.  HENT:      Mouth/Throat:     Mouth: Mucous membranes are moist.  Cardiovascular:     Rate and Rhythm: Normal rate.  Pulmonary:     Effort: Pulmonary effort is normal. No respiratory distress.  Abdominal:     General: Bowel sounds are normal. There is no distension.     Palpations: Abdomen is soft.     Tenderness: There is no abdominal tenderness. There is no guarding or rebound.     Comments: Patient reports nontender to palpation of the abdomen, but reports that it feels deeper and makes him like he needs to vomit.  Normal active bowel sounds.  No overlying skin changes noted.  Soft.  Nondistended.  Genitourinary:    Comments: Patient eloped prior to rectal exam Skin:    General: Skin is warm and dry.  Neurological:     Mental Status: He is alert.     ED Results / Procedures / Treatments   Labs (all labs ordered are listed, but only abnormal results are displayed) Labs Reviewed  COMPREHENSIVE METABOLIC PANEL - Abnormal; Notable for the following components:      Result Value   Glucose, Bld 264 (*)    Calcium 8.8 (*)    All other components within normal limits  URINALYSIS, ROUTINE W REFLEX MICROSCOPIC - Abnormal; Notable for the following components:   Color, Urine STRAW (*)    Glucose, UA >=500 (*)    All other components within normal limits  CBG MONITORING, ED - Abnormal; Notable for the following components:   Glucose-Capillary 194 (*)    All other components within normal limits  LIPASE, BLOOD  CBC  CBG MONITORING, ED  POC OCCULT BLOOD, ED    EKG None  Radiology CT ABDOMEN PELVIS W CONTRAST  Result Date: 07/14/2022 CLINICAL DATA:  Diffuse abdominal pain. EXAM: CT ABDOMEN AND PELVIS WITH CONTRAST TECHNIQUE: Multidetector CT imaging of the abdomen and pelvis was performed using the standard protocol following bolus administration of intravenous contrast. RADIATION DOSE REDUCTION: This exam was performed according to the departmental dose-optimization program which  includes automated exposure control, adjustment of the mA and/or kV according to patient size and/or use of iterative reconstruction technique. CONTRAST:  OMNIPAQUE IOHEXOL 300 MG/ML  SOLN COMPARISON:  None Available. FINDINGS: Lower chest: There is some linear opacity lung bases likely scar or atelectasis. No pleural effusion. Hepatobiliary: Mild fatty liver infiltration. Focal fat deposition as well along the liver adjacent to the falciform ligament in segment 4. Patent portal vein. Gallbladder is nondilated. Pancreas: Unremarkable. No pancreatic ductal dilatation or surrounding inflammatory changes. Spleen: Spleen is enlarged with the AP length of 16.2 cm. Preserved enhancement. Adrenals/Urinary Tract: Adrenal glands are unremarkable. Kidneys are normal, without renal calculi, focal lesion, or hydronephrosis. Bladder is unremarkable. Stomach/Bowel: Stomach and small bowel are nondilated. Large bowel has  a normal course and caliber with scattered stool. Normal appendix. There is some wall thickening along the rectum with some stranding and loss of fold pattern. Recommend further evaluation to exclude an aggressive lesion or an area proctitis. Vascular/Lymphatic: No significant vascular findings are present. No enlarged abdominal or pelvic lymph nodes. Reproductive: Prostate is unremarkable. Other: No abdominal wall hernia or abnormality. No abdominopelvic ascites. Musculoskeletal: No acute or significant osseous findings. IMPRESSION: Slight wall thickening suggested along the low rectum with a slightly featureless appearance and slight stranding. Please correlate with clinical findings and recommend further evaluation to exclude underlying pathology. Scattered colonic stool more proximally. Normal appendix. No bowel obstruction, free air or free fluid. Fatty liver infiltration with splenomegaly. Electronically Signed   By: Karen Kays M.D.   On: 07/14/2022 16:42    Procedures Procedures   Medications  Ordered in ED Medications  pantoprazole (PROTONIX) injection 40 mg (40 mg Intravenous Given 07/14/22 1548)  ondansetron (ZOFRAN) injection 4 mg (4 mg Intravenous Given 07/14/22 1548)  lactated ringers bolus 1,000 mL (0 mLs Intravenous Stopped 07/14/22 1702)  iohexol (OMNIPAQUE) 300 MG/ML solution 100 mL (100 mLs Intravenous Contrast Given 07/14/22 1618)    ED Course/ Medical Decision Making/ A&P   Medical Decision Making Amount and/or Complexity of Data Reviewed Labs: ordered. Radiology: ordered.  Risk Prescription drug management.   27 y.o. male presents to the ER for evaluation of nausea and vomiting. Differential diagnosis includes but is not limited to ACS/MI, Boerhaave's, DKA, elevated ICP, Ischemic bowel, Sepsis, Drug-related (toxicity, THC hyperemesis, ETOH, withdrawal), Appendicitis, Bowel obstruction, Electrolyte abnormalities, Pancreatitis, Biliary colic, Gastroenteritis, Gastroparesis, Hepatitis, Migraine, Thyroid disease, Renal colic, GERD/PUD, UTI. Vital signs shows mildly increased blood pressure otherwise afebrile, normal pulse rate, satting murmur. Physical exam as noted above.   I independently reviewed and interpreted the patient's labs.  CMP shows elevated glucose at 264 the patient is known diabetic.  Calcium mildly decreased at 8.8.  No other electrolyte or LFT abnormality.  Urinalysis shows straw-colored urine with glucose present however no other signs of infection.  Lipase within normal limits.  CBC without cytosis or anemia.  POC occult need to be collected.  CT shows slight wall thickening suggested along the low rectum with a slightly featureless appearance and slight stranding. Please correlate with clinical findings and recommend further evaluation to exclude underlying pathology. Scattered colonic stool more proximally. Normal appendix. No bowel obstruction, free air or free fluid. Fatty liver infiltration with splenomegaly.  The patient reports that a few months  prior he was treated for chlamydia with doxycycline however was only given a month  Patient eloped prior to rectal exam.    Portions of this report may have been transcribed using voice recognition software. Every effort was made to ensure accuracy; however, inadvertent computerized transcription errors may be present.  Final Clinical Impression(s) / ED Diagnoses Final diagnoses:  Eloped from emergency department    Rx / DC Orders ED Discharge Orders     None

## 2022-07-14 NOTE — ED Provider Notes (Signed)
[facility] AT [facility] Provider Note   CSN: [id number] Arrival date & time: 07/14/22  1320     History Chief Complaint  Patient presents with   Nausea   Emesis    Darryl Brown is a 27 y.o. male with history of type 2 diabetes, HIV presents emerged from today for evaluation of nausea, vomiting, and diffuse abdominal cramping.  He reports that it all started around 1 week ago but has been worsening since Saturday.  Reports has been having some softer stools but denies any diarrhea.  Denies any melena or hematochezia.  Denies any chest pain, shortness of breath, fever, chills, dysuria, hematuria, or any testicular or penile complaints.  Denies any rectal pain or any tenesmus.  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  He has not tried any medication for his symptoms.  He reports he is able to eat some foods and not able to eat others.  He reports that he had some greenish stool yesterday after the salad he ate.  He reports that he threw up some hashbrowns that he ate this morning but was able to tolerate some other foods.  He is a daily marijuana user.   Emesis Associated symptoms: abdominal pain   Associated symptoms: no chills, no diarrhea and no fever        Home Medications Prior to Admission medications   Medication Sig Start Date End Date Taking? Authorizing Provider  acetaminophen (TYLENOL) 325 MG tablet Take 650 mg by mouth every 6 (six) hours as needed for mild pain.    [provider]  bictegravir-emtricitabine-tenofovir AF (BIKTARVY) 50-200-25 MG TABS tablet TAKE 1 TABLET BY MOUTH DAILY 06/19/22   Horton Finer, PA-C  Cholecalciferol 1.25 MG (50000 UT) capsule Take 1 capsule by mouth once a week. 06/19/20   [provider]  clotrimazole (LOTRIMIN) 1 % cream Apply 1 Application topically 2 (two) times daily. 02/27/22   Horton Finer, PA-C  dapagliflozin propanediol (FARXIGA) 10 MG TABS tablet  Take 1 tablet (10 mg total) by mouth daily. 02/27/22   Horton Finer, PA-C  escitalopram (LEXAPRO) 10 MG tablet Take 10 mg by mouth daily. 01/03/21   [provider]  glipiZIDE (GLUCOTROL XL) 10 MG 24 hr tablet Take 1 tablet (10 mg total) by mouth daily. 02/27/22   Horton Finer, PA-C  Insulin Pen Needle (UNIFINE PENTIPS) 32G X 4 MM MISC Use 1 pen needle once daily. 06/20/21   Merrilyn Puma, MD  lidocaine (LIDODERM) 5 % Place 1 patch onto the skin daily. Remove & Discard patch within 12 hours or as directed by MD 03/10/22   Raspet, Denny Peon K, PA-C  liraglutide (VICTOZA) 18 MG/3ML SOPN Inject 0.6 mg into the skin daily. 02/27/22   Horton Finer, PA-C  loratadine (CLARITIN) 10 MG tablet Take 1 tablet by mouth daily. 12/31/20   [provider]  methocarbamol (ROBAXIN) 500 MG tablet Take 1 tablet (500 mg total) by mouth every 8 (eight) hours as needed for muscle spasms. 03/10/22   Raspet, Noberto Retort, PA-C  vitamin B-12 (CYANOCOBALAMIN) 500 MCG tablet Take by mouth. 12/31/20   [provider]      Allergies    Metformin and Sertraline    Review of Systems   Review of Systems  Constitutional:  Negative for chills and fever.  Respiratory:  Negative for shortness of breath.   Cardiovascular:  Negative for chest pain.  Gastrointestinal:  Positive for abdominal pain, nausea and vomiting. Negative for anal bleeding, blood in stool, constipation, diarrhea and rectal pain.  Genitourinary:  Negative for dysuria, hematuria, penile discharge, penile pain, penile swelling, scrotal swelling and testicular pain.  Musculoskeletal:  Negative for joint swelling.    Physical Exam Updated Vital Signs BP (!) 146/95 (BP Location: Left Arm)   Pulse 65   Temp 98.2 F (36.8 C) (Oral)   Resp 17   SpO2 100%  Physical Exam Vitals and nursing note reviewed.  Constitutional:      General: He is not in acute distress.    Appearance: He is not ill-appearing or toxic-appearing.  HENT:      Mouth/Throat:     Mouth: Mucous membranes are moist.  Cardiovascular:     Rate and Rhythm: Normal rate.  Pulmonary:     Effort: Pulmonary effort is normal. No respiratory distress.  Abdominal:     General: Bowel sounds are normal. There is no distension.     Palpations: Abdomen is soft.     Tenderness: There is no abdominal tenderness. There is no guarding or rebound.     Comments: Patient reports nontender to palpation of the abdomen, but reports that it feels deeper and makes him like he needs to vomit.  Normal active bowel sounds.  No overlying skin changes noted.  Soft.  Nondistended.  Genitourinary:    Comments: Patient eloped prior to rectal exam Skin:    General: Skin is warm and dry.  Neurological:     Mental Status: He is alert.    ED Results / Procedures / Treatments   Labs (all labs ordered are listed, but only abnormal results are displayed) Labs Reviewed  COMPREHENSIVE METABOLIC PANEL - Abnormal; Notable for the following components:      Result Value   Glucose, Bld 264 (*)    Calcium 8.8 (*)    All other components within normal limits  URINALYSIS, ROUTINE W REFLEX MICROSCOPIC - Abnormal; Notable for the following components:   Color, Urine STRAW (*)    Glucose, UA >=500 (*)    All other components within normal limits  CBG MONITORING, ED - Abnormal; Notable for the following components:   Glucose-Capillary 194 (*)    All other components within normal limits  LIPASE, BLOOD  CBC  CBG MONITORING, ED  POC OCCULT BLOOD, ED    EKG None  Radiology CT ABDOMEN PELVIS W CONTRAST  Result Date: 07/14/2022 CLINICAL DATA:  Diffuse abdominal pain. EXAM: CT ABDOMEN AND PELVIS WITH CONTRAST TECHNIQUE: Multidetector CT imaging of the abdomen and pelvis was performed using the standard protocol following bolus administration of intravenous contrast. RADIATION DOSE REDUCTION: This exam was performed according to the departmental dose-optimization program which includes  automated exposure control, adjustment of the mA and/or kV according to patient size and/or use of iterative reconstruction technique. CONTRAST:  OMNIPAQUE IOHEXOL 300 MG/ML  SOLN COMPARISON:  None Available. FINDINGS: Lower chest: There is some linear opacity lung bases likely scar or atelectasis. No pleural effusion. Hepatobiliary: Mild fatty liver infiltration. Focal fat deposition as well along the liver adjacent to the falciform ligament in segment 4. Patent portal vein. Gallbladder is nondilated. Pancreas: Unremarkable. No pancreatic ductal dilatation or surrounding inflammatory changes. Spleen: Spleen is enlarged with the AP length of 16.2 cm. Preserved enhancement. Adrenals/Urinary Tract: Adrenal glands are unremarkable. Kidneys are normal, without renal calculi, focal lesion, or hydronephrosis. Bladder is unremarkable. Stomach/Bowel: Stomach and small bowel are nondilated. Large bowel has a  normal course and caliber with scattered stool. Normal appendix. There is some wall thickening along the rectum with some stranding and loss of fold pattern. Recommend further evaluation to exclude an aggressive lesion or an area proctitis. Vascular/Lymphatic: No significant vascular findings are present. No enlarged abdominal or pelvic lymph nodes. Reproductive: Prostate is unremarkable. Other: No abdominal wall hernia or abnormality. No abdominopelvic ascites. Musculoskeletal: No acute or significant osseous findings. IMPRESSION: Slight wall thickening suggested along the low rectum with a slightly featureless appearance and slight stranding. Please correlate with clinical findings and recommend further evaluation to exclude underlying pathology. Scattered colonic stool more proximally. Normal appendix. No bowel obstruction, free air or free fluid. Fatty liver infiltration with splenomegaly. Electronically Signed   By: Karen Kays M.D.   On: 07/14/2022 16:42    Procedures Procedures   Medications Ordered in  ED Medications  pantoprazole (PROTONIX) injection 40 mg (40 mg Intravenous Given 07/14/22 1548)  ondansetron (ZOFRAN) injection 4 mg (4 mg Intravenous Given 07/14/22 1548)  lactated ringers bolus 1,000 mL (0 mLs Intravenous Stopped 07/14/22 1702)  iohexol (OMNIPAQUE) 300 MG/ML solution 100 mL (100 mLs Intravenous Contrast Given 07/14/22 1618)    ED Course/ Medical Decision Making/ A&P   Medical Decision Making Amount and/or Complexity of Data Reviewed Labs: ordered. Radiology: ordered.  Risk Prescription drug management.   27 y.o. male presents to the ER for evaluation of nausea and vomiting. Differential diagnosis includes but is not limited to ACS/MI, Boerhaave's, DKA, elevated ICP, Ischemic bowel, Sepsis, Drug-related (toxicity, THC hyperemesis, ETOH, withdrawal), Appendicitis, Bowel obstruction, Electrolyte abnormalities, Pancreatitis, Biliary colic, Gastroenteritis, Gastroparesis, Hepatitis, Migraine, Thyroid disease, Renal colic, GERD/PUD, UTI. Vital signs shows mildly increased blood pressure otherwise afebrile, normal pulse rate, satting murmur. Physical exam as noted above.   She was given 1 L IV fluid, Zofran, and Protonix for symptoms.  I independently reviewed and interpreted the patient's labs.  CMP shows elevated glucose at 264 the patient is known diabetic.  Calcium mildly decreased at 8.8.  No other electrolyte or LFT abnormality.  Urinalysis shows straw-colored urine with glucose present however no other signs of infection.  Lipase within normal limits.  CBC without cytosis or anemia.  POC occult need to be collected.  CT shows slight wall thickening suggested along the low rectum with a slightly featureless appearance and slight stranding. Please correlate with clinical findings and recommend further evaluation to exclude underlying pathology. Scattered colonic stool more proximally. Normal appendix. No bowel obstruction, free air or free fluid. Fatty liver infiltration with  splenomegaly.  The patient reports that a few months prior he was treated for chlamydia with doxycycline however was only given a week.  Patient denies any rectal pain but reports he does receive anal sex with a recently unprotected partner.  He reports that he was compliant with the doxycycline and take it twice daily for a week.  Patient eloped prior to rectal exam.  Infectious Disease can follow up with possible not completely treated anal chlamydia. He denied any tenesmus or rectal pain with defecation.   Portions of this report may have been transcribed using voice recognition software. Every effort was made to ensure accuracy; however, inadvertent computerized transcription errors may be present.   Final Clinical Impression(s) / ED Diagnoses Final diagnoses:  Eloped from emergency department    Rx / DC Orders ED Discharge Orders     None         Achille Rich, PA-C 07/15/22 0219    Vivi Barrack  N, MD 07/15/22 [id number]

## 2022-07-15 LAB — CBG MONITORING, ED: Glucose-Capillary: 252 mg/dL — ABNORMAL HIGH (ref 70–99)

## 2022-08-06 ENCOUNTER — Other Ambulatory Visit: Payer: Self-pay

## 2022-08-06 DIAGNOSIS — B20 Human immunodeficiency virus [HIV] disease: Secondary | ICD-10-CM

## 2022-08-06 MED ORDER — BIKTARVY 50-200-25 MG PO TABS
1.0000 | ORAL_TABLET | Freq: Every day | ORAL | 1 refills | Status: DC
Start: 2022-08-06 — End: 2023-05-12

## 2023-05-12 ENCOUNTER — Other Ambulatory Visit: Payer: Self-pay

## 2023-05-12 ENCOUNTER — Encounter: Payer: Self-pay | Admitting: Internal Medicine

## 2023-05-12 ENCOUNTER — Ambulatory Visit: Payer: Self-pay

## 2023-05-12 ENCOUNTER — Ambulatory Visit: Payer: Self-pay | Admitting: Internal Medicine

## 2023-05-12 VITALS — BP 143/92 | HR 71 | Temp 97.9°F

## 2023-05-12 DIAGNOSIS — Z79899 Other long term (current) drug therapy: Secondary | ICD-10-CM

## 2023-05-12 DIAGNOSIS — Z113 Encounter for screening for infections with a predominantly sexual mode of transmission: Secondary | ICD-10-CM

## 2023-05-12 DIAGNOSIS — B20 Human immunodeficiency virus [HIV] disease: Secondary | ICD-10-CM

## 2023-05-12 MED ORDER — BIKTARVY 50-200-25 MG PO TABS
1.0000 | ORAL_TABLET | Freq: Every day | ORAL | 1 refills | Status: DC
Start: 2023-05-12 — End: 2023-05-17

## 2023-05-12 NOTE — Progress Notes (Signed)
 [facility]     HPI: Darryl Brown is a 28 y.o. male presents for HIV management on bIktarvy .  Pt states he missed about 20 doses in the last month. He states he misses doses sue to coming in terms of diagnosis. He sates he lost his support as no nlonger living with best friend. HE states he feels depressed lately.  Date of diagnosis: 2021 ART exposure biktarvy  Past Ois none Risk factors: MSM Partners in last 95months1, in the last 12 months2.  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  No family history on file. Current Outpatient Medications on File Prior to Visit  Medication Sig Dispense Refill   acetaminophen  (TYLENOL ) 325 MG tablet Take 650 mg by mouth every 6 (six) hours as needed for mild pain.     bictegravir-emtricitabine-tenofovir AF (BIKTARVY ) 50-200-25 MG TABS tablet Take 1 tablet by mouth daily. 30 tablet 1   Cholecalciferol 1.25 MG (50000 UT) capsule Take 1 capsule by mouth once a week.     clotrimazole  (LOTRIMIN ) 1 % cream Apply 1 Application topically 2 (two) times daily. 30 g 0   dapagliflozin  propanediol (FARXIGA ) 10 MG TABS tablet Take 1 tablet (10 mg total) by mouth daily. 30 tablet 0   escitalopram (LEXAPRO) 10 MG tablet Take 10 mg by mouth daily.     glipiZIDE  (GLUCOTROL  XL) 10 MG 24 hr tablet Take 1 tablet (10 mg total) by mouth daily. 30 tablet 0   Insulin  Pen Needle (UNIFINE PENTIPS) 32G X 4 MM MISC Use 1 pen needle once daily. 100 each 3   lidocaine  (LIDODERM ) 5 % Place 1 patch onto the skin daily. Remove & Discard patch within 12 hours or as directed by MD 10 patch 0    liraglutide  (VICTOZA ) 18 MG/3ML SOPN Inject 0.6 mg into the skin daily. 3 mL 0   loratadine (CLARITIN) 10 MG tablet Take 1 tablet by mouth daily.     methocarbamol  (ROBAXIN ) 500 MG tablet Take 1 tablet (500 mg total) by mouth every 8 (eight) hours as needed for muscle spasms. 30 tablet 0   vitamin B-12 (CYANOCOBALAMIN) 500 MCG tablet Take by mouth.     No current facility-administered medications on file prior to visit.    Allergies  Allergen Reactions   Metformin  Other (See Comments)   Sertraline Other (See Comments)    Night sweats      Lab Results HIV 1 RNA Quant  Date Value  02/27/2022 <20 Copies/mL (H)  06/24/2021 Not Detected Copies/mL  01/30/2021 55 copies/mL (H)   CD4 T Cell Abs (/uL)  Date Value  06/24/2021 433  01/30/2021 289 (L)   No results found for: "HIV1GENOSEQ" Lab Results  Component Value Date   WBC 5.5 07/14/2022   HGB 14.8 07/14/2022   HCT 44.5 07/14/2022   MCV 87.1 07/14/2022   PLT 192 07/14/2022    Lab Results  Component Value Date   CREATININE 0.88 07/14/2022   BUN 9 07/14/2022   NA 137 07/14/2022   K 3.8 07/14/2022   CL 103 07/14/2022   CO2 27 07/14/2022   Lab Results  Component  Value Date   ALT 20 07/14/2022   AST 16 07/14/2022   ALKPHOS 52 07/14/2022   BILITOT 0.7 07/14/2022    Lab Results  Component Value Date   CHOL 179 02/27/2022   TRIG 46 02/27/2022   HDL 62 02/27/2022   LDLCALC 103 (H) 02/27/2022   Lab Results  Component Value Date   HAV REACTIVE (A) 01/30/2021   Lab Results  Component Value Date   HEPBSAG NON-REACTIVE 01/30/2021   HEPBSAB REACTIVE (A) 01/30/2021   No results found for: "HCVAB" Lab Results  Component Value Date   CHLAMYDIAWP Positive (A) 04/01/2022   N Negative 04/01/2022   No results found for: "GCPROBEAPT" No results found for: "QUANTGOLD"  Assessment/Plan #HIV -CD4 433, VL ND, [id number] -HIV labs today -Refills biktarvy  -F?U in one month for close montoring  #counseling app  #DM -f/u  with pcp #Vaccination COVID Flu Monkeypox PCV 20 06/24/21 Meningitis HepA HEpB Tdap Shingles  #Health maintenance -Quantiferon today 05/12/23 -RPR NR 3/25 -HCV today 05/12/23 -GC3 point today 05/12/23(oral Carita Charm urin+ gohorhrea + 02/27/22, pt recived rocephin  x 1 dose  Age dependent -Lipid -Dysplasia screen F/M -Mammogram  -Colonoscopy    Darryl Bjornstad, MD [facility]   I have personally spent 41 minutes involved in face-to-face and non-face-to-face activities for this patient on the day of the visit. Professional time spent includes the following activities: Preparing to see the patient (review of tests), Obtaining and/or reviewing separately obtained history (admission/discharge record), Performing a medically appropriate examination and/or evaluation , Ordering medications/tests/procedures, referring and communicating with other health care professionals, Documenting clinical information in the EMR, Independently interpreting results (not separately reported), Communicating results to the patient/family/caregiver, Counseling and educating the patient/family/caregiver and Care coordination (not separately reported).

## 2023-05-13 LAB — CYTOLOGY, (ORAL, ANAL, URETHRAL) ANCILLARY ONLY
Chlamydia: NEGATIVE
Chlamydia: NEGATIVE
Comment: NEGATIVE
Comment: NEGATIVE
Comment: NORMAL
Comment: NORMAL
Neisseria Gonorrhea: NEGATIVE
Neisseria Gonorrhea: POSITIVE — AB

## 2023-05-13 LAB — URINE CYTOLOGY ANCILLARY ONLY
Chlamydia: NEGATIVE
Comment: NEGATIVE
Comment: NORMAL
Neisseria Gonorrhea: NEGATIVE

## 2023-05-13 LAB — T-HELPER CELLS (CD4) COUNT (NOT AT ARMC)
CD4 % Helper T Cell: 38 % (ref 33–65)
CD4 T Cell Abs: 432 /uL (ref 400–1790)

## 2023-05-17 ENCOUNTER — Other Ambulatory Visit: Payer: Self-pay

## 2023-05-17 DIAGNOSIS — B20 Human immunodeficiency virus [HIV] disease: Secondary | ICD-10-CM

## 2023-05-17 MED ORDER — BIKTARVY 50-200-25 MG PO TABS
1.0000 | ORAL_TABLET | Freq: Every day | ORAL | 1 refills | Status: DC
Start: 2023-05-17 — End: 2023-06-08

## 2023-05-21 LAB — HIV-1 INTEGRASE GENOTYPE

## 2023-05-21 LAB — CBC WITH DIFFERENTIAL/PLATELET
Absolute Lymphocytes: 1304 {cells}/uL (ref 850–3900)
Absolute Monocytes: 212 {cells}/uL (ref 200–950)
Basophils Absolute: 20 {cells}/uL (ref 0–200)
Basophils Relative: 0.5 %
Eosinophils Absolute: 12 {cells}/uL — ABNORMAL LOW (ref 15–500)
Eosinophils Relative: 0.3 %
HCT: 41.7 % (ref 38.5–50.0)
Hemoglobin: 14.4 g/dL (ref 13.2–17.1)
MCH: 29.8 pg (ref 27.0–33.0)
MCHC: 34.5 g/dL (ref 32.0–36.0)
MCV: 86.3 fL (ref 80.0–100.0)
MPV: 11.9 fL (ref 7.5–12.5)
Monocytes Relative: 5.3 %
Neutro Abs: 2452 {cells}/uL (ref 1500–7800)
Neutrophils Relative %: 61.3 %
Platelets: 154 10*3/uL (ref 140–400)
RBC: 4.83 10*6/uL (ref 4.20–5.80)
RDW: 12.4 % (ref 11.0–15.0)
Total Lymphocyte: 32.6 %
WBC: 4 10*3/uL (ref 3.8–10.8)

## 2023-05-21 LAB — COMPLETE METABOLIC PANEL WITHOUT GFR
AG Ratio: 1.8 (calc) (ref 1.0–2.5)
ALT: 21 U/L (ref 9–46)
AST: 11 U/L (ref 10–40)
Albumin: 4.2 g/dL (ref 3.6–5.1)
Alkaline phosphatase (APISO): 53 U/L (ref 36–130)
BUN: 16 mg/dL (ref 7–25)
CO2: 27 mmol/L (ref 20–32)
Calcium: 9.1 mg/dL (ref 8.6–10.3)
Chloride: 105 mmol/L (ref 98–110)
Creat: 0.77 mg/dL (ref 0.60–1.24)
Globulin: 2.4 g/dL (ref 1.9–3.7)
Glucose, Bld: 226 mg/dL — ABNORMAL HIGH (ref 65–99)
Potassium: 4 mmol/L (ref 3.5–5.3)
Sodium: 138 mmol/L (ref 135–146)
Total Bilirubin: 0.7 mg/dL (ref 0.2–1.2)
Total Protein: 6.6 g/dL (ref 6.1–8.1)

## 2023-05-21 LAB — LIPID PANEL
Cholesterol: 151 mg/dL (ref <200)
HDL: 59 mg/dL (ref >=40)
LDL Cholesterol (Calc): 80 mg/dL
Non-HDL Cholesterol (Calc): 92 mg/dL (ref <130)
Total CHOL/HDL Ratio: 2.6 (calc) (ref <5.0)
Triglycerides: 50 mg/dL (ref <150)

## 2023-05-21 LAB — QUANTIFERON-TB GOLD PLUS
Mitogen-NIL: 7.59 [IU]/mL
NIL: 0.01 [IU]/mL
QuantiFERON-TB Gold Plus: NEGATIVE
TB1-NIL: 0 [IU]/mL
TB2-NIL: 0 [IU]/mL

## 2023-05-21 LAB — HEMOGLOBIN A1C
Hgb A1c MFr Bld: 7.7 % — ABNORMAL HIGH (ref <5.7)
Mean Plasma Glucose: 174 mg/dL
eAG (mmol/L): 9.7 mmol/L

## 2023-05-21 LAB — RPR: RPR Ser Ql: NONREACTIVE

## 2023-05-21 LAB — HIV RNA, RTPCR W/R GT (RTI, PI,INT)
HIV 1 RNA Quant: 911 {copies}/mL — ABNORMAL HIGH
HIV-1 RNA Quant, Log: 2.96 {Log_copies}/mL — ABNORMAL HIGH

## 2023-05-21 LAB — HIV-1 GENOTYPE: HIV-1 Genotype: DETECTED — AB

## 2023-05-21 LAB — HEPATITIS C ANTIBODY: Hepatitis C Ab: NONREACTIVE

## 2023-06-07 ENCOUNTER — Ambulatory Visit: Payer: Self-pay | Admitting: Internal Medicine

## 2023-06-08 ENCOUNTER — Other Ambulatory Visit: Payer: Self-pay

## 2023-06-08 ENCOUNTER — Encounter: Payer: Self-pay | Admitting: Internal Medicine

## 2023-06-08 ENCOUNTER — Ambulatory Visit (INDEPENDENT_AMBULATORY_CARE_PROVIDER_SITE_OTHER): Payer: Self-pay | Admitting: Internal Medicine

## 2023-06-08 ENCOUNTER — Ambulatory Visit: Payer: Self-pay | Admitting: Licensed Clinical Social Worker

## 2023-06-08 ENCOUNTER — Telehealth: Payer: Self-pay

## 2023-06-08 VITALS — BP 119/76 | HR 91 | Resp 16 | Ht 73.0 in | Wt 206.0 lb

## 2023-06-08 DIAGNOSIS — B20 Human immunodeficiency virus [HIV] disease: Secondary | ICD-10-CM

## 2023-06-08 DIAGNOSIS — Z113 Encounter for screening for infections with a predominantly sexual mode of transmission: Secondary | ICD-10-CM

## 2023-06-08 DIAGNOSIS — Z23 Encounter for immunization: Secondary | ICD-10-CM

## 2023-06-08 DIAGNOSIS — A549 Gonococcal infection, unspecified: Secondary | ICD-10-CM

## 2023-06-08 MED ORDER — CEFTRIAXONE SODIUM 500 MG IJ SOLR
500.0000 mg | Freq: Once | INTRAMUSCULAR | Status: AC
Start: 2023-06-08 — End: 2023-06-08
  Administered 2023-06-08: 500 mg via INTRAMUSCULAR

## 2023-06-08 MED ORDER — BIKTARVY 50-200-25 MG PO TABS: 1.0000 | ORAL_TABLET | Freq: Every day | ORAL | 6 refills | Status: DC

## 2023-06-08 NOTE — Patient Instructions (Signed)
Mental Health Resources  988: can call or text 24/7  [facility]: Address: [address], [place], [place] [address] Open 24 hours Phone: [phone]  [facility] [place]: Address: [address], [place], [place] [address] Phone: [phone] Appointments: [email or web address]

## 2023-06-08 NOTE — Progress Notes (Unsigned)
 [facility]     HPI: Darryl Brown is a 28 y.o. male presents for HIV management on bIktarvy .  Today 06/08/23: Date of diagnosis: 2021 ART exposure biktarvy  Past Ois none Risk factors: MSM Partners in last 46months1, in the last 12 months2.  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  No family history on file. Current Outpatient Medications on File Prior to Visit  Medication Sig Dispense Refill   acetaminophen  (TYLENOL ) 325 MG tablet Take 650 mg by mouth every 6 (six) hours as needed for mild pain.     bictegravir-emtricitabine-tenofovir AF (BIKTARVY ) 50-200-25 MG TABS tablet Take 1 tablet by mouth daily. 30 tablet 1   Cholecalciferol 1.25 MG (50000 UT) capsule Take 1 capsule by mouth once a week.     clotrimazole  (LOTRIMIN ) 1 % cream Apply 1 Application topically 2 (two) times daily. 30 g 0   dapagliflozin  propanediol (FARXIGA ) 10 MG TABS tablet Take 1 tablet (10 mg total) by mouth daily. 30 tablet 0   escitalopram (LEXAPRO) 10 MG tablet Take 10 mg by mouth daily.     glipiZIDE  (GLUCOTROL  XL) 10 MG 24 hr tablet Take 1 tablet (10 mg total) by mouth daily. 30 tablet 0   Insulin  Pen Needle (UNIFINE PENTIPS) 32G X 4 MM MISC Use 1 pen needle once daily. 100 each 3   lidocaine  (LIDODERM ) 5 % Place 1 patch onto the skin daily. Remove & Discard patch within 12 hours or as directed by MD 10 patch 0   liraglutide  (VICTOZA ) 18 MG/3ML SOPN Inject 0.6 mg into the skin daily. 3 mL 0   loratadine (CLARITIN) 10 MG tablet Take 1 tablet by mouth daily.     No current facility-administered medications on file  prior to visit.    Allergies  Allergen Reactions   Metformin  Other (See Comments)   Sertraline Other (See Comments)    Night sweats      Lab Results HIV 1 RNA Quant  Date Value  05/12/2023 911 copies/mL (H)  02/27/2022 <20 Copies/mL (H)  06/24/2021 Not Detected Copies/mL   CD4 T Cell Abs (/uL)  Date Value  05/12/2023 432  06/24/2021 433  01/30/2021 289 (L)   No results found for: "HIV1GENOSEQ" Lab Results  Component Value Date   WBC 4.0 05/12/2023   HGB 14.4 05/12/2023   HCT 41.7 05/12/2023   MCV 86.3 05/12/2023   PLT 154 05/12/2023    Lab Results  Component Value Date   CREATININE 0.77 05/12/2023   BUN 16 05/12/2023   NA 138 05/12/2023   K 4.0 05/12/2023   CL 105 05/12/2023   CO2 27 05/12/2023   Lab Results  Component Value Date   ALT 21 05/12/2023   AST 11 05/12/2023   ALKPHOS 52 07/14/2022   BILITOT 0.7 05/12/2023    Lab Results  Component Value Date   CHOL 151 05/12/2023   TRIG 50 05/12/2023   HDL 59 05/12/2023   LDLCALC 80 05/12/2023   Lab Results  Component Value Date   HAV REACTIVE (A) 01/30/2021   Lab Results  Component Value Date   HEPBSAG NON-REACTIVE  01/30/2021   HEPBSAB REACTIVE (A) 01/30/2021   No results found for: "HCVAB" Lab Results  Component Value Date   CHLAMYDIAWP Negative 05/12/2023   CHLAMYDIAWP Negative 05/12/2023   CHLAMYDIAWP Negative 05/12/2023   N Negative 05/12/2023   N Negative 05/12/2023   N Positive (A) 05/12/2023   No results found for: "GCPROBEAPT" No results found for: "QUANTGOLD"  Assessment/Plan #HIV -CD4 432, VL 911, on 05/12/23 after missing biktarvy  doses -F/U in 3 mtonhs for close montoring - he has a pratner hiv +, now confides with friends. Only one missed dose of biktarvy  -Pt plans to schedule counsleing appt.    #DM -f/u with pcp. A1c 7.7 k 05/12/23   #Vaccination COVID Flu Monkeypox PCV 20 06/24/21 Meningitis=->fist dose today HepA immune HEpB immune Tdap Shingles   #Health  maintenance -Quantiferon today 05/12/23 -RPR NR 05/12/23 -HCVnr 05/12/23 -GC3 point 4/23 with + gonorrhea anal. Ceftr today. Gc 3 pint today   Age dependent -Lipid -Dysplasia screen F/M -Colonoscopy     [name], MD [facility]   I have personally spent 41 minutes involved in face-to-face and non-face-to-face activities for this patient on the day of the visit. Professional time spent includes the following activities: Preparing to see the patient (review of tests), Obtaining and/or reviewing separately obtained history (admission/discharge record), Performing a medically appropriate examination and/or evaluation , Ordering medications/tests/procedures, referring and communicating with other health care professionals, Documenting clinical information in the EMR, Independently interpreting results (not separately reported), Communicating results to the patient/family/caregiver, Counseling and educating the patient/family/caregiver and Care coordination (not separately reported).

## 2023-06-08 NOTE — Telephone Encounter (Signed)
 Detectable Viral Load Intervention (DVL)  Most recent VL:  HIV 1 RNA Quant  Date Value Ref Range Status  05/12/2023 911 (H) copies/mL Final  02/27/2022 <20 (H) Copies/mL Final    Comment:    HIV-1 RNA Detected  06/24/2021 Not Detected Copies/mL Final    Last Clinic Visit: 06/08/23  Current ART regimen: Biktarvy   Appointment status: patient has future appointment scheduled  Medication last dispensed (per chart review):   Dispensed Days Supply Quantity Provider Pharmacy  BIKTARVY  50/200/25MG  TABLETS 05/18/2023 30 30 each [name], MD [facility]...    Medication Adherence   What pharmacy do you use for your ART? [facility]  Do you pick up your medication at the pharmacy or is it mailed to you? pick up at pharmacy  How often do you miss a dose your ART?   Are you experiencing any side effects with your ART?   Are you having any trouble remembering what medication(s) you are supposed to take or how you are supposed to take them?   What helps you remember to take your medication(s)?    Barriers to Care   Not able to assess   Interventions   Called patient to discuss medication adherence and possible barriers to care.  Patient had appt today. Will call back after VL returns to discuss medication adherence/ barriers to care. Julien Odor, RMA

## 2023-06-09 LAB — CYTOLOGY, (ORAL, ANAL, URETHRAL) ANCILLARY ONLY
Chlamydia: NEGATIVE
Chlamydia: NEGATIVE
Comment: NEGATIVE
Comment: NORMAL
Comment: NEGATIVE
Comment: NORMAL
Neisseria Gonorrhea: NEGATIVE
Neisseria Gonorrhea: NEGATIVE

## 2023-06-09 LAB — URINE CYTOLOGY ANCILLARY ONLY
Chlamydia: NEGATIVE
Comment: NEGATIVE
Comment: NORMAL
Neisseria Gonorrhea: NEGATIVE

## 2023-06-10 LAB — HIV-1 RNA QUANT-NO REFLEX-BLD
HIV 1 RNA Quant: NOT DETECTED {copies}/mL
HIV-1 RNA Quant, Log: NOT DETECTED {Log_copies}/mL

## 2023-06-10 LAB — RPR: RPR Ser Ql: NONREACTIVE

## 2023-06-11 NOTE — Telephone Encounter (Signed)
 Reviewed results with patient. Congratulated him on undetectable status. Encouraged he continue to take medication daily and follow up as planned. Julien Odor, RMA

## 2023-06-15 ENCOUNTER — Ambulatory Visit: Payer: Self-pay | Admitting: Licensed Clinical Social Worker

## 2023-06-19 ENCOUNTER — Ambulatory Visit (HOSPITAL_COMMUNITY)
Admission: EM | Admit: 2023-06-19 | Discharge: 2023-06-19 | Disposition: A | Discharge status: Home / Self Care | Payer: Self-pay | Attending: Physician Assistant | Admitting: Physician Assistant

## 2023-06-19 ENCOUNTER — Ambulatory Visit (INDEPENDENT_AMBULATORY_CARE_PROVIDER_SITE_OTHER): Payer: Self-pay

## 2023-06-19 ENCOUNTER — Encounter (HOSPITAL_COMMUNITY): Payer: Self-pay | Admitting: *Deleted

## 2023-06-19 DIAGNOSIS — R051 Acute cough: Secondary | ICD-10-CM

## 2023-06-19 DIAGNOSIS — J40 Bronchitis, not specified as acute or chronic: Secondary | ICD-10-CM

## 2023-06-19 DIAGNOSIS — J329 Chronic sinusitis, unspecified: Secondary | ICD-10-CM

## 2023-06-19 MED ORDER — ALBUTEROL SULFATE HFA 108 (90 BASE) MCG/ACT IN AERS
2.0000 | INHALATION_SPRAY | Freq: Once | RESPIRATORY_TRACT | Status: AC
  Administered 2023-06-19: 2 via RESPIRATORY_TRACT

## 2023-06-19 MED ORDER — ALBUTEROL SULFATE HFA 108 (90 BASE) MCG/ACT IN AERS
INHALATION_SPRAY | RESPIRATORY_TRACT | Status: AC
  Filled 2023-06-19: qty 6.7

## 2023-06-19 MED ORDER — PROMETHAZINE-DM 6.25-15 MG/5ML PO SYRP: 5.0000 mL | ORAL_SOLUTION | Freq: Three times a day (TID) | ORAL | 0 refills | Status: AC | PRN

## 2023-06-19 MED ORDER — AMOXICILLIN-POT CLAVULANATE 875-125 MG PO TABS
1.0000 | ORAL_TABLET | Freq: Two times a day (BID) | ORAL | 0 refills | Status: AC
Start: 2023-06-19 — End: ?

## 2023-06-19 NOTE — Discharge Instructions (Addendum)
 I will contact you if the radiologist sees anything that changes our treatment plan.  Start Augmentin  twice daily for 7 days.  Use the albuterol inhaler every 4-6 hours as needed.  Take Promethazine  DM for cough.  This will make you sleepy so do not drive or drink alcohol with taking it.  You can use over-the-counter medication including Mucinex, Tylenol , Flonase, nasal saline/sinus rinses as well as a humidifier in your room to help with your symptoms.  If you are not feeling better within a few days please return for reevaluation.  If anything worsens and you have chest pain, shortness of breath, weakness, worsening cough, fever you need to be seen immediately.

## 2023-06-19 NOTE — ED Triage Notes (Signed)
 Pt states he started with sire throat Tuesday of last week that has resolved. Now he complains of congestion, cough Tuesday this week. He is taking Theraflu.

## 2023-06-19 NOTE — ED Provider Notes (Signed)
 [facility]    CSN: [id number] Arrival date & time: 06/19/23  1733      History   Chief Complaint Chief Complaint  Patient presents with   Cough   Nasal Congestion    HPI Darryl Brown is a 28 y.o. male.   Patient presents today with a 2-week history of URI symptoms.  He reports that initially this was a mild URI with sore throat and congestion but this improved until it worsened within the past week.  He is now experiencing some occasional shortness of breath and cough.  He has been taking TheraFlu without improvement of symptoms.  Denies any recent antibiotic or steroids.  He does have a history of HIV but is compliant with his antiviral therapy and his last blood work obtained last month indicated he was undetectable.  Denies history of asthma, COPD, smoking.    Past Medical History:  Diagnosis Date   Diabetes mellitus without complication (HCC)    HIV (human immunodeficiency virus infection) (HCC)    Obesity     Patient Active Problem List   Diagnosis Date Noted   Need for prophylactic vaccination against Streptococcus pneumoniae (pneumococcus) 06/24/2021   Syphilis 06/20/2021   Chlamydia 06/20/2021   Human immunodeficiency virus (HIV) disease (HCC) 01/30/2021   Type 2 diabetes mellitus without complications (HCC) 01/30/2021    History reviewed. No pertinent surgical history.     Home Medications    Prior to Admission medications   Medication Sig Start Date End Date Taking? Authorizing Provider  amoxicillin -clavulanate (AUGMENTIN ) 875-125 MG tablet Take 1 tablet by mouth every 12 (twelve) hours. 06/19/23  Yes Mckoy Bhakta K, PA-C  bictegravir-emtricitabine-tenofovir AF (BIKTARVY ) 50-200-25 MG TABS tablet Take 1 tablet by mouth daily. 06/08/23  Yes [name], MD  promethazine -dextromethorphan (PROMETHAZINE -DM) 6.25-15 MG/5ML syrup Take 5 mLs by mouth 3 (three) times daily as needed for cough. 06/19/23  Yes Rohil Lesch K, PA-C  acetaminophen   (TYLENOL ) 325 MG tablet Take 650 mg by mouth every 6 (six) hours as needed for mild pain. Patient not taking: Reported on 06/08/2023    [provider]  Cholecalciferol 1.25 MG (50000 UT) capsule Take 1 capsule by mouth once a week. Patient not taking: Reported on 06/08/2023 06/19/20   [provider]  clotrimazole  (LOTRIMIN ) 1 % cream Apply 1 Application topically 2 (two) times daily. Patient not taking: Reported on 06/08/2023 02/27/22   Jamesetta Mcbride, PA-C  dapagliflozin  propanediol (FARXIGA ) 10 MG TABS tablet Take 1 tablet (10 mg total) by mouth daily. 02/27/22   Jamesetta Mcbride, PA-C  escitalopram (LEXAPRO) 10 MG tablet Take 10 mg by mouth daily. Patient not taking: Reported on 06/08/2023 01/03/21   [provider]  glipiZIDE  (GLUCOTROL  XL) 10 MG 24 hr tablet Take 1 tablet (10 mg total) by mouth daily. Patient not taking: Reported on 06/08/2023 02/27/22   Jamesetta Mcbride, PA-C  Insulin  Pen Needle (UNIFINE PENTIPS) 32G X 4 MM MISC Use 1 pen needle once daily. Patient not taking: Reported on 06/08/2023 06/20/21   Mandy Second, MD  lidocaine  (LIDODERM ) 5 % Place 1 patch onto the skin daily. Remove & Discard patch within 12 hours or as directed by MD Patient not taking: Reported on 06/08/2023 03/10/22   Jaquil Todt K, PA-C  liraglutide  (VICTOZA ) 18 MG/3ML SOPN Inject 0.6 mg into the skin daily. Patient not taking: Reported on 06/08/2023 02/27/22   Jamesetta Mcbride, PA-C  loratadine (CLARITIN) 10 MG tablet Take 1 tablet by mouth  daily. Patient not taking: Reported on 06/08/2023 12/31/20   [provider]    Family History History reviewed. No pertinent family history.  Social History Social History   Tobacco Use   Smoking status: Former    Types: Cigarettes    Passive exposure: Never   Smokeless tobacco: Former   Tobacco comments:    Black and Milds 2-3 per day  Vaping Use   Vaping status: Former  Substance Use Topics   Alcohol use: Yes    Comment: Pt  states about 3 beers a week   Drug use: Yes    Types: Marijuana     Allergies   Metformin  and Sertraline   Review of Systems Review of Systems  Constitutional:  Positive for activity change and fatigue. Negative for appetite change and fever.  HENT:  Positive for congestion and sore throat. Negative for sinus pressure and sneezing.   Respiratory:  Positive for cough and shortness of breath.   Cardiovascular:  Negative for chest pain.  Gastrointestinal:  Negative for abdominal pain, diarrhea, nausea and vomiting.  Musculoskeletal:  Positive for arthralgias and myalgias.     Physical Exam Triage Vital Signs ED Triage Vitals  Encounter Vitals Group     BP 06/19/23 1747 (!) 136/91     Systolic BP Percentile --      Diastolic BP Percentile --      Pulse Rate 06/19/23 1747 93     Resp 06/19/23 1747 18     Temp 06/19/23 1747 99.2 F (37.3 C)     Temp Source 06/19/23 1747 Oral     SpO2 06/19/23 1747 97 %     Weight --      Height --      Head Circumference --      Peak Flow --      Pain Score 06/19/23 1746 0     Pain Loc --      Pain Education --      Exclude from Growth Chart --    No data found.  Updated Vital Signs BP (!) 136/91 (BP Location: Right Arm)   Pulse 93   Temp 99.2 F (37.3 C) (Oral)   Resp 18   SpO2 97%   Visual Acuity Right Eye Distance:   Left Eye Distance:   Bilateral Distance:    Right Eye Near:   Left Eye Near:    Bilateral Near:     Physical Exam Vitals reviewed.  Constitutional:      General: He is awake.     Appearance: Normal appearance. He is well-developed. He is not ill-appearing.     Comments: Very pleasant male appears stated age in no acute distress sitting comfortably in exam room  HENT:     Head: Normocephalic and atraumatic.     Right Ear: Tympanic membrane, ear canal and external ear normal. Tympanic membrane is not erythematous or bulging.     Left Ear: Tympanic membrane, ear canal and external ear normal. Tympanic  membrane is not erythematous or bulging.     Nose: Nose normal.     Mouth/Throat:     Pharynx: Uvula midline. Posterior oropharyngeal erythema present. No oropharyngeal exudate or uvula swelling.  Cardiovascular:     Rate and Rhythm: Normal rate and regular rhythm.     Heart sounds: Normal heart sounds, S1 normal and S2 normal. No murmur heard. Pulmonary:     Effort: Pulmonary effort is normal. No accessory muscle usage or respiratory distress.  Breath sounds: No stridor. Examination of the left-lower field reveals decreased breath sounds. Decreased breath sounds present. No wheezing, rhonchi or rales.  Neurological:     Mental Status: He is alert.  Psychiatric:        Behavior: Behavior is cooperative.      UC Treatments / Results  Labs (all labs ordered are listed, but only abnormal results are displayed) Labs Reviewed - No data to display  EKG   Radiology DG Chest 2 View Result Date: 06/19/2023 CLINICAL DATA:  Cough. EXAM: CHEST - 2 VIEW COMPARISON:  12/27/2015. FINDINGS: The heart size and mediastinal contours are within normal limits. Both lungs are clear. No pleural effusion or pneumothorax. The visualized skeletal structures are unremarkable. IMPRESSION: No acute cardiopulmonary findings. Electronically Signed   By: Mannie Seek M.D.   On: 06/19/2023 18:51    Procedures Procedures (including critical care time)  Medications Ordered in UC Medications  albuterol (VENTOLIN HFA) 108 (90 Base) MCG/ACT inhaler 2 puff (2 puffs Inhalation Given 06/19/23 1827)    Initial Impression / Assessment and Plan / UC Course  I have reviewed the triage vital signs and the nursing notes.  Pertinent labs & imaging results that were available during my care of the patient were reviewed by me and considered in my medical decision making (see chart for details).     Patient is well-appearing, afebrile, nontoxic, nontachycardic.  Notification for viral testing is given symptomatic  for several weeks this would not change management.  Chest x-ray was obtained that showed no acute cardiopulmonary based on my primary read.  At time discharge we will reading for radiologist overread.  Will treat for sinobronchitis given several week history of symptoms.  He was started on Augmentin  twice daily for 7 days.  He was given albuterol in clinic and sent home with this medication to be used every 4-6 hours as needed.  He can use over-the-counter medication for additional symptom relief.  He was given Promethazine  DM for cough.  We discussed that this can be sedating and he is not to drive or drink alcohol with taking it.  If he is not feeling better within 3 to 5 days or if anything worsens he needs to be seen immediately.  Strict return precautions given.  Excuse note provided.  Final Clinical Impressions(s) / UC Diagnoses   Final diagnoses:  Acute cough  Sinobronchitis     Discharge Instructions      I will contact you if the radiologist sees anything that changes our treatment plan.  Start Augmentin  twice daily for 7 days.  Use the albuterol inhaler every 4-6 hours as needed.  Take Promethazine  DM for cough.  This will make you sleepy so do not drive or drink alcohol with taking it.  You can use over-the-counter medication including Mucinex, Tylenol , Flonase, nasal saline/sinus rinses as well as a humidifier in your room to help with your symptoms.  If you are not feeling better within a few days please return for reevaluation.  If anything worsens and you have chest pain, shortness of breath, weakness, worsening cough, fever you need to be seen immediately.   ED Prescriptions     Medication Sig Dispense Auth. Provider   amoxicillin -clavulanate (AUGMENTIN ) 875-125 MG tablet Take 1 tablet by mouth every 12 (twelve) hours. 14 tablet Lonette Stevison K, PA-C   promethazine -dextromethorphan (PROMETHAZINE -DM) 6.25-15 MG/5ML syrup Take 5 mLs by mouth 3 (three) times daily as needed for  cough. 118 mL Denica Web K, PA-C  PDMP not reviewed this encounter.   Budd Cargo, PA-C 06/19/23 [id number]

## 2023-06-21 ENCOUNTER — Ambulatory Visit (HOSPITAL_COMMUNITY): Payer: Self-pay

## 2023-06-24 ENCOUNTER — Emergency Department (HOSPITAL_COMMUNITY)
Admission: EM | Admit: 2023-06-24 | Discharge: 2023-06-24 | Disposition: A | Discharge status: Home / Self Care | Payer: Self-pay | Attending: Emergency Medicine | Admitting: Emergency Medicine

## 2023-06-24 ENCOUNTER — Encounter (HOSPITAL_COMMUNITY): Payer: Self-pay | Admitting: *Deleted

## 2023-06-24 ENCOUNTER — Other Ambulatory Visit: Payer: Self-pay

## 2023-06-24 ENCOUNTER — Emergency Department (HOSPITAL_COMMUNITY): Payer: Self-pay

## 2023-06-24 DIAGNOSIS — U071 COVID-19: Secondary | ICD-10-CM | POA: Insufficient documentation

## 2023-06-24 DIAGNOSIS — Z21 Asymptomatic human immunodeficiency virus [HIV] infection status: Secondary | ICD-10-CM | POA: Insufficient documentation

## 2023-06-24 DIAGNOSIS — E1165 Type 2 diabetes mellitus with hyperglycemia: Secondary | ICD-10-CM | POA: Insufficient documentation

## 2023-06-24 DIAGNOSIS — Z794 Long term (current) use of insulin: Secondary | ICD-10-CM | POA: Insufficient documentation

## 2023-06-24 DIAGNOSIS — Z7984 Long term (current) use of oral hypoglycemic drugs: Secondary | ICD-10-CM | POA: Insufficient documentation

## 2023-06-24 DIAGNOSIS — J069 Acute upper respiratory infection, unspecified: Secondary | ICD-10-CM

## 2023-06-24 LAB — RESP PANEL BY RT-PCR (RSV, FLU A&B, COVID)  RVPGX2
Influenza A by PCR: NEGATIVE
Influenza B by PCR: NEGATIVE
Resp Syncytial Virus by PCR: NEGATIVE
SARS Coronavirus 2 by RT PCR: POSITIVE — AB

## 2023-06-24 LAB — CBG MONITORING, ED: Glucose-Capillary: 186 mg/dL — ABNORMAL HIGH (ref 70–99)

## 2023-06-24 MED ORDER — CETIRIZINE HCL 10 MG PO TABS
10.0000 mg | ORAL_TABLET | Freq: Every day | ORAL | 0 refills | Status: AC
Start: 2023-06-24 — End: ?

## 2023-06-24 MED ORDER — FLUTICASONE PROPIONATE 50 MCG/ACT NA SUSP
2.0000 | Freq: Every day | NASAL | 0 refills | Status: AC
Start: 2023-06-24 — End: ?

## 2023-06-24 MED ORDER — METFORMIN HCL 500 MG PO TABS
500.0000 mg | ORAL_TABLET | Freq: Two times a day (BID) | ORAL | 0 refills | Status: DC
Start: 2023-06-24 — End: 2023-09-22

## 2023-06-24 NOTE — Discharge Planning (Signed)
 RNCM consulted regarding PCP establishment and insurance enrollment. Pt presented to [facility] ED today with upper respiratory infection symptoms. RNCM obtained appointment on  July 30, 2023  with Z. Jamal Mays, NP and placed on After Visit Summary paperwork.  No further case management needs communicated at this time. Brittain Smithey J. Rachel Budds, BSN, RN, [place] [phone]

## 2023-06-24 NOTE — Discharge Instructions (Addendum)
 Continue antibiotics that you were prescribed at urgent care.  You can use prescribed Flonase and Zyrtec to help with swelling and congestion of the nose and sinuses.  You can also use over-the-counter sinus rinses.  Drink plenty of water.  Try taking metformin  twice daily to help keep your blood sugar under better control, taking this with food on your stomach can help reduce diarrhea.  You have been scheduled for a follow-up appointment for establishing primary care.

## 2023-06-24 NOTE — ED Provider Notes (Signed)
 [place] EMERGENCY DEPARTMENT AT [facility] Provider Note   CSN: [id number] Arrival date & time: 06/24/23  1128     History  Chief Complaint  Patient presents with   URI    Darryl Brown is a 28 y.o. male.  Darryl Brown is a 28 y.o. male with a history of type II DM, and HIV who presents with persistent cough. Reports symptoms started about 3 weeks ago with a sore through and runny nose then developed cough that has become worse with some associated wheezing and mild shortness of breath. Reports chest soreness with coughing, but no chest pain. No nausea, vomiting or diarrhea. No known fevers.Went to urgent car on 5/31 and was prescribe augmentin , cough medication and an inhaler, and he felt like he was improving but after going back to work for the past two days he is feeling worse again. He does report smoking marijuana, but denies cigarette use or vaping. Reports HIV has been well controlled and he follows up regularly, but has not been making medication for diabetes regularly, tries to eat low carb and occasionally gives insulin    URI Presenting symptoms: congestion, cough, rhinorrhea and sore throat   Presenting symptoms: no fever   Associated symptoms: sinus pain and wheezing        Home Medications Prior to Admission medications   Medication Sig Start Date End Date Taking? Authorizing Provider  cetirizine  (ZYRTEC ) 10 MG tablet Take 1 tablet (10 mg total) by mouth daily. 06/24/23  Yes Joniah Bednarski F, PA-C  fluticasone  (FLONASE ) 50 MCG/ACT nasal spray Place 2 sprays into both nostrils daily. 06/24/23  Yes Ronelle Michie F, PA-C  metFORMIN  (GLUCOPHAGE ) 500 MG tablet Take 1 tablet (500 mg total) by mouth 2 (two) times daily with a meal. 06/24/23  Yes Rupert Counts F, PA-C  acetaminophen  (TYLENOL ) 325 MG tablet Take 650 mg by mouth every 6 (six) hours as needed for mild pain. Patient not taking: Reported on 06/08/2023    [provider]   amoxicillin -clavulanate (AUGMENTIN ) 875-125 MG tablet Take 1 tablet by mouth every 12 (twelve) hours. 06/19/23   Raspet, Erin K, PA-C  bictegravir-emtricitabine-tenofovir AF (BIKTARVY ) 50-200-25 MG TABS tablet Take 1 tablet by mouth daily. 06/08/23   [name], MD  Cholecalciferol 1.25 MG (50000 UT) capsule Take 1 capsule by mouth once a week. Patient not taking: Reported on 06/08/2023 06/19/20   [provider]  clotrimazole  (LOTRIMIN ) 1 % cream Apply 1 Application topically 2 (two) times daily. Patient not taking: Reported on 06/08/2023 02/27/22   Jamesetta Mcbride, PA-C  dapagliflozin  propanediol (FARXIGA ) 10 MG TABS tablet Take 1 tablet (10 mg total) by mouth daily. 02/27/22   Jamesetta Mcbride, PA-C  escitalopram (LEXAPRO) 10 MG tablet Take 10 mg by mouth daily. Patient not taking: Reported on 06/08/2023 01/03/21   [provider]  glipiZIDE  (GLUCOTROL  XL) 10 MG 24 hr tablet Take 1 tablet (10 mg total) by mouth daily. Patient not taking: Reported on 06/08/2023 02/27/22   Jamesetta Mcbride, PA-C  Insulin  Pen Needle (UNIFINE PENTIPS) 32G X 4 MM MISC Use 1 pen needle once daily. Patient not taking: Reported on 06/08/2023 06/20/21   Mandy Second, MD  lidocaine  (LIDODERM ) 5 % Place 1 patch onto the skin daily. Remove & Discard patch within 12 hours or as directed by MD Patient not taking: Reported on 06/08/2023 03/10/22   Raspet, Erin K, PA-C  liraglutide  (VICTOZA ) 18 MG/3ML SOPN Inject 0.6 mg into the skin daily.  Patient not taking: Reported on 06/08/2023 02/27/22   Jamesetta Mcbride, PA-C  promethazine -dextromethorphan (PROMETHAZINE -DM) 6.25-15 MG/5ML syrup Take 5 mLs by mouth 3 (three) times daily as needed for cough. 06/19/23   Raspet, Erin K, PA-C      Allergies    Metformin  and Sertraline    Review of Systems   Review of Systems  Constitutional:  Positive for chills. Negative for fever.  HENT:  Positive for congestion, rhinorrhea, sinus pain and sore throat.   Respiratory:   Positive for cough, shortness of breath and wheezing.   Cardiovascular:  Negative for chest pain.  Gastrointestinal:  Negative for abdominal pain, diarrhea, nausea and vomiting.    Physical Exam Updated Vital Signs BP 132/88 (BP Location: Left Arm)   Pulse 76   Temp 98.2 F (36.8 C) (Oral)   Resp 16   Ht 6\' 1"  (1.854 m)   Wt 93.4 kg   SpO2 100%   BMI 27.17 kg/m  Physical Exam Vitals and nursing note reviewed.  Constitutional:      General: He is not in acute distress.    Appearance: Normal appearance. He is well-developed and normal weight. He is not ill-appearing or diaphoretic.  HENT:     Head: Normocephalic and atraumatic.     Nose: Congestion and rhinorrhea present.     Comments: Mild maxillary sinus tenderness    Mouth/Throat:     Mouth: Mucous membranes are moist.     Pharynx: Oropharynx is clear. No oropharyngeal exudate or posterior oropharyngeal erythema.  Eyes:     General:        Right eye: No discharge.        Left eye: No discharge.  Neck:     Comments: No rigidity Cardiovascular:     Rate and Rhythm: Normal rate and regular rhythm.     Heart sounds: Normal heart sounds. No murmur heard.    No friction rub. No gallop.  Pulmonary:     Effort: Pulmonary effort is normal. No respiratory distress.     Breath sounds: Normal breath sounds.     Comments: Respirations equal and unlabored, patient able to speak in full sentences, lungs clear to auscultation bilaterally  Abdominal:     General: Bowel sounds are normal. There is no distension.     Palpations: Abdomen is soft. There is no mass.     Tenderness: There is no abdominal tenderness. There is no guarding.     Comments: Abdomen soft, nondistended, nontender to palpation in all quadrants without guarding or peritoneal signs  Musculoskeletal:        General: No deformity.     Cervical back: Neck supple.  Lymphadenopathy:     Cervical: No cervical adenopathy.  Skin:    General: Skin is warm and dry.      Capillary Refill: Capillary refill takes less than 2 seconds.  Neurological:     Mental Status: He is alert and oriented to person, place, and time.  Psychiatric:        Mood and Affect: Mood normal.        Behavior: Behavior normal.     ED Results / Procedures / Treatments   Labs (all labs ordered are listed, but only abnormal results are displayed) Labs Reviewed  RESP PANEL BY RT-PCR (RSV, FLU A&B, COVID)  RVPGX2 - Abnormal; Notable for the following components:      Result Value   SARS Coronavirus 2 by RT PCR POSITIVE (*)    All other  components within normal limits  CBG MONITORING, ED - Abnormal; Notable for the following components:   Glucose-Capillary 186 (*)    All other components within normal limits    EKG None  Radiology DG Chest 2 View Result Date: 06/24/2023 CLINICAL DATA:  Cough. EXAM: CHEST - 2 VIEW COMPARISON:  Jun 19, 2023. FINDINGS: The heart size and mediastinal contours are within normal limits. Both lungs are clear. The visualized skeletal structures are unremarkable. IMPRESSION: No active cardiopulmonary disease. Electronically Signed   By: Rosalene Colon M.D.   On: 06/24/2023 13:09    Procedures Procedures    Medications Ordered in ED Medications - No data to display  ED Course/ Medical Decision Making/ A&P                                 Medical Decision Making Amount and/or Complexity of Data Reviewed Radiology: ordered.  Risk OTC drugs. Prescription drug management.   28 yo male presents with persistent cough and 3 weeks of URI symptoms, started on ABX after urgent care visit 5 days ago with some improvement, worsening after returning to work. Pt is very well appearing with normal vitals and clear lungs.   Repeat CXR viewed and interpreted without evidence of pneumonia, covid/flu negative, mildly hyperglycemic.   Provided patient with reassurance and encouraged him to complete antibiotics, counseled patient that cough can last for  several weeks, encouraged use of flonase , zyrtec  and saline rinses to help with sinus congestion. Provided work note.  At this time there does not appear to be any evidence of an acute emergency medical condition requiring further emergent evaluation and the patient appears stable for discharge with appropriate outpatient follow up. Diagnosis and return precautions discussed with patient who verbalizes understanding and is agreeable to discharge.           Final Clinical Impression(s) / ED Diagnoses Final diagnoses:  Upper respiratory tract infection, unspecified type    Rx / DC Orders ED Discharge Orders          Ordered    metFORMIN  (GLUCOPHAGE ) 500 MG tablet  2 times daily with meals        06/24/23 1454    fluticasone  (FLONASE ) 50 MCG/ACT nasal spray  Daily        06/24/23 1454    cetirizine  (ZYRTEC ) 10 MG tablet  Daily        06/24/23 1454              Everlyn Hockey, PA-C 06/26/23 1804    Sallyanne Creamer, DO 06/29/23 1557

## 2023-06-24 NOTE — ED Notes (Signed)
 Patient transported to X-ray

## 2023-06-24 NOTE — ED Triage Notes (Signed)
 Pt presents with continued URI symptoms. He was seen at [facility] on 5/31 and told to come to ED if he did not improve.

## 2023-06-28 ENCOUNTER — Ambulatory Visit (HOSPITAL_COMMUNITY)
Admission: EM | Admit: 2023-06-28 | Discharge: 2023-06-28 | Disposition: A | Discharge status: Home / Self Care | Payer: Self-pay

## 2023-06-28 ENCOUNTER — Encounter (HOSPITAL_COMMUNITY): Payer: Self-pay

## 2023-06-28 DIAGNOSIS — J069 Acute upper respiratory infection, unspecified: Secondary | ICD-10-CM

## 2023-06-28 NOTE — Discharge Instructions (Signed)
 Covid-19 infections are viral and should last 3-5 days. For cough you can take 5 mL of the promethazine -DM cough syrup. For body aches you can alternate between 800 mg of ibuprofen  and 500 mg of tylenol  very 4-6 hours. Ensure you are drinking at least 64 ounces of water daily and getting plenty of rest. We chose not to re-test you for Covid-19 today because while your swab may still be positive, you are not considered contagious if you are not having fevers.   Symptoms should continue to improve. If any changes return to clinic for re-evaluation.

## 2023-06-28 NOTE — ED Triage Notes (Signed)
 Patient states he tested positive for Covid on 06/24/23 and wants to be retested to make sure he does not have it anymore. Patient states he needs a doctor's note for work.

## 2023-06-28 NOTE — ED Provider Notes (Signed)
 [facility]    CSN: [id number] Arrival date & time: 06/28/23  1627      History   Chief Complaint Chief Complaint  Patient presents with   Cough    HPI Darryl Brown is a 28 y.o. male.   Patient presents to clinic over concerns of Covid-19 infection.   Patient tested positive for Covid-19 infection 4 days ago at a local ED. He has been managing his cough w/ promethazine  DM. Still has some Augmentin  left from previous UC visit and being diagnosed w/ sinobronchitis. Has had some shortness of breath since sinobronchitis diagnosis. Denies wheezing. No fevers. Some body aches.   Hx of HIV, diagnosed in 2021. Follows w/ ID. On Biktarvy , well-controlled.   The history is provided by the patient and medical records.  Cough   Past Medical History:  Diagnosis Date   Diabetes mellitus without complication (HCC)    HIV (human immunodeficiency virus infection) (HCC)    Obesity     Patient Active Problem List   Diagnosis Date Noted   Need for prophylactic vaccination against Streptococcus pneumoniae (pneumococcus) 06/24/2021   Syphilis 06/20/2021   Chlamydia 06/20/2021   Human immunodeficiency virus (HIV) disease (HCC) 01/30/2021   Type 2 diabetes mellitus without complications (HCC) 01/30/2021    History reviewed. No pertinent surgical history.     Home Medications    Prior to Admission medications   Medication Sig Start Date End Date Taking? Authorizing Provider  acetaminophen  (TYLENOL ) 325 MG tablet Take 650 mg by mouth every 6 (six) hours as needed for mild pain. Patient not taking: Reported on 06/08/2023    [provider]  amoxicillin -clavulanate (AUGMENTIN ) 875-125 MG tablet Take 1 tablet by mouth every 12 (twelve) hours. 06/19/23   Raspet, Erin K, PA-C  bictegravir-emtricitabine-tenofovir AF (BIKTARVY ) 50-200-25 MG TABS tablet Take 1 tablet by mouth daily. 06/08/23   [name], MD  cetirizine  (ZYRTEC ) 10 MG tablet Take 1 tablet (10 mg total)  by mouth daily. 06/24/23   Kehrli, Kelsey F, PA-C  Cholecalciferol 1.25 MG (50000 UT) capsule Take 1 capsule by mouth once a week. Patient not taking: Reported on 06/08/2023 06/19/20   [provider]  clotrimazole  (LOTRIMIN ) 1 % cream Apply 1 Application topically 2 (two) times daily. Patient not taking: Reported on 06/08/2023 02/27/22   Jamesetta Mcbride, PA-C  dapagliflozin  propanediol (FARXIGA ) 10 MG TABS tablet Take 1 tablet (10 mg total) by mouth daily. 02/27/22   Jamesetta Mcbride, PA-C  escitalopram (LEXAPRO) 10 MG tablet Take 10 mg by mouth daily. Patient not taking: Reported on 06/08/2023 01/03/21   [provider]  fluticasone  (FLONASE ) 50 MCG/ACT nasal spray Place 2 sprays into both nostrils daily. 06/24/23   Kehrli, Kelsey F, PA-C  glipiZIDE  (GLUCOTROL  XL) 10 MG 24 hr tablet Take 1 tablet (10 mg total) by mouth daily. Patient not taking: Reported on 06/08/2023 02/27/22   Jamesetta Mcbride, PA-C  Insulin  Pen Needle (UNIFINE PENTIPS) 32G X 4 MM MISC Use 1 pen needle once daily. Patient not taking: Reported on 06/08/2023 06/20/21   Mandy Second, MD  lidocaine  (LIDODERM ) 5 % Place 1 patch onto the skin daily. Remove & Discard patch within 12 hours or as directed by MD Patient not taking: Reported on 06/08/2023 03/10/22   Raspet, Erin K, PA-C  liraglutide  (VICTOZA ) 18 MG/3ML SOPN Inject 0.6 mg into the skin daily. Patient not taking: Reported on 06/08/2023 02/27/22   Jamesetta Mcbride, PA-C  metFORMIN  (GLUCOPHAGE ) 500 MG tablet  Take 1 tablet (500 mg total) by mouth 2 (two) times daily with a meal. 06/24/23   Everlyn Hockey, PA-C  promethazine -dextromethorphan (PROMETHAZINE -DM) 6.25-15 MG/5ML syrup Take 5 mLs by mouth 3 (three) times daily as needed for cough. 06/19/23   Raspet, Betsey Brow, PA-C    Family History History reviewed. No pertinent family history.  Social History Social History   Tobacco Use   Smoking status: Former    Types: Cigarettes    Passive exposure: Never    Smokeless tobacco: Former   Tobacco comments:    Black and Milds 2-3 per day  Vaping Use   Vaping status: Former  Substance Use Topics   Alcohol use: Yes    Comment: Pt states about 3 beers a week   Drug use: Yes    Types: Marijuana     Allergies   Metformin  and Sertraline   Review of Systems Review of Systems  Per HPI  Physical Exam Triage Vital Signs ED Triage Vitals [06/28/23 1654]  Encounter Vitals Group     BP 139/85     Systolic BP Percentile      Diastolic BP Percentile      Pulse Rate 89     Resp 16     Temp 97.9 F (36.6 C)     Temp Source Oral     SpO2 96 %     Weight      Height      Head Circumference      Peak Flow      Pain Score 0     Pain Loc      Pain Education      Exclude from Growth Chart    No data found.  Updated Vital Signs BP 139/85 (BP Location: Right Arm)   Pulse 89   Temp 97.9 F (36.6 C) (Oral)   Resp 16   SpO2 96%   Visual Acuity Right Eye Distance:   Left Eye Distance:   Bilateral Distance:    Right Eye Near:   Left Eye Near:    Bilateral Near:     Physical Exam Vitals and nursing note reviewed.  Constitutional:      Appearance: Normal appearance.  HENT:     Head: Normocephalic and atraumatic.     Right Ear: External ear normal.     Left Ear: External ear normal.     Mouth/Throat:     Mouth: Mucous membranes are moist.  Eyes:     Conjunctiva/sclera: Conjunctivae normal.  Cardiovascular:     Rate and Rhythm: Normal rate and regular rhythm.     Heart sounds: Normal heart sounds. No murmur heard. Pulmonary:     Effort: Pulmonary effort is normal. No respiratory distress.     Breath sounds: Normal breath sounds.  Neurological:     General: No focal deficit present.     Mental Status: He is alert.  Psychiatric:        Mood and Affect: Mood normal.      UC Treatments / Results  Labs (all labs ordered are listed, but only abnormal results are displayed) Labs Reviewed - No data to  display  EKG   Radiology No results found.  Procedures Procedures (including critical care time)  Medications Ordered in UC Medications - No data to display  Initial Impression / Assessment and Plan / UC Course  I have reviewed the triage vital signs and the nursing notes.  Pertinent labs & imaging results that were available during  my care of the patient were reviewed by me and considered in my medical decision making (see chart for details).  Vitals and triage reviewed, patient is hemodynamically stable.  Lungs are vesicular, heart w/ RRR. Well-controlled HIV. Covid-19 + 4 days ago, discussed re-testing would not be valuable at this time. Symptomatic management for viral URI discussed, work note provided. All questions answered.      Final Clinical Impressions(s) / UC Diagnoses   Final diagnoses:  Viral URI with cough     Discharge Instructions      Covid-19 infections are viral and should last 3-5 days. For cough you can take 5 mL of the promethazine -DM cough syrup. For body aches you can alternate between 800 mg of ibuprofen  and 500 mg of tylenol  very 4-6 hours. Ensure you are drinking at least 64 ounces of water daily and getting plenty of rest. We chose not to re-test you for Covid-19 today because while your swab may still be positive, you are not considered contagious if you are not having fevers.   Symptoms should continue to improve. If any changes return to clinic for re-evaluation.    ED Prescriptions   None    PDMP not reviewed this encounter.   Harlow Lighter, Siera Beyersdorf  N, FNP 06/28/23 1724

## 2023-07-30 ENCOUNTER — Inpatient Hospital Stay: Payer: Self-pay | Admitting: Nurse Practitioner

## 2023-09-06 ENCOUNTER — Ambulatory Visit: Payer: Self-pay | Admitting: Internal Medicine

## 2023-09-06 NOTE — Progress Notes (Deleted)
 [facility]     HPI: Darryl Brown is a 28 y.o. male presents for HIV management on bIktarvy .  Today 06/08/23: Date of diagnosis: 2021 ART exposure biktarvy  Past Ois none Risk factors: MSM Partners in last 51months1, in the last 12 months2.  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  No family history on file. Current Outpatient Medications on File Prior to Visit  Medication Sig Dispense Refill   acetaminophen  (TYLENOL ) 325 MG tablet Take 650 mg by mouth every 6 (six) hours as needed for mild pain. (Patient not taking: Reported on 06/08/2023)     amoxicillin -clavulanate (AUGMENTIN ) 875-125 MG tablet Take 1 tablet by mouth every 12 (twelve) hours. 14 tablet 0   bictegravir-emtricitabine-tenofovir AF (BIKTARVY ) 50-200-25 MG TABS tablet Take 1 tablet by mouth daily. 30 tablet 6   cetirizine  (ZYRTEC ) 10 MG tablet Take 1 tablet (10 mg total) by mouth daily. 30 tablet 0   Cholecalciferol 1.25 MG (50000 UT) capsule Take 1 capsule by mouth once a week. (Patient not taking: Reported on 06/08/2023)     clotrimazole  (LOTRIMIN ) 1 % cream Apply 1 Application topically 2 (two) times daily. (Patient not taking: Reported on 06/08/2023) 30 g 0   dapagliflozin  propanediol (FARXIGA ) 10 MG TABS tablet Take 1 tablet (10 mg total) by mouth daily. 30 tablet 0   escitalopram (LEXAPRO) 10 MG tablet Take 10 mg by mouth daily. (Patient not taking: Reported on 06/08/2023)     fluticasone  (FLONASE ) 50 MCG/ACT nasal spray Place 2 sprays into both nostrils daily. 11.1 mL 0   glipiZIDE  (GLUCOTROL  XL) 10 MG 24 hr tablet Take 1 tablet  (10 mg total) by mouth daily. (Patient not taking: Reported on 06/08/2023) 30 tablet 0   Insulin  Pen Needle (UNIFINE PENTIPS) 32G X 4 MM MISC Use 1 pen needle once daily. (Patient not taking: Reported on 06/08/2023) 100 each 3   lidocaine  (LIDODERM ) 5 % Place 1 patch onto the skin daily. Remove & Discard patch within 12 hours or as directed by MD (Patient not taking: Reported on 06/08/2023) 10 patch 0   liraglutide  (VICTOZA ) 18 MG/3ML SOPN Inject 0.6 mg into the skin daily. (Patient not taking: Reported on 06/08/2023) 3 mL 0   metFORMIN  (GLUCOPHAGE ) 500 MG tablet Take 1 tablet (500 mg total) by mouth 2 (two) times daily with a meal. 60 tablet 0   promethazine -dextromethorphan (PROMETHAZINE -DM) 6.25-15 MG/5ML syrup Take 5 mLs by mouth 3 (three) times daily as needed for cough. 118 mL 0   No current facility-administered medications on file prior to visit.    Allergies  Allergen Reactions   Metformin  Other (See Comments)   Sertraline Other (See Comments)    Night sweats      Lab Results HIV 1 RNA Quant  Date Value  06/08/2023 NOT DETECTED copies/mL  05/12/2023 911 copies/mL (H)  02/27/2022 <20 Copies/mL (H)   CD4 T Cell Abs (/uL)  Date Value  05/12/2023 432  06/24/2021 433  01/30/2021 289 (L)   No results found for: HIV1GENOSEQ Lab Results  Component Value Date   WBC 4.0 05/12/2023   HGB 14.4 05/12/2023  HCT 41.7 05/12/2023   MCV 86.3 05/12/2023   PLT 154 05/12/2023    Lab Results  Component Value Date   CREATININE 0.77 05/12/2023   BUN 16 05/12/2023   NA 138 05/12/2023   K 4.0 05/12/2023   CL 105 05/12/2023   CO2 27 05/12/2023   Lab Results  Component Value Date   ALT 21 05/12/2023   AST 11 05/12/2023   ALKPHOS 52 07/14/2022   BILITOT 0.7 05/12/2023    Lab Results  Component Value Date   CHOL 151 05/12/2023   TRIG 50 05/12/2023   HDL 59 05/12/2023   LDLCALC 80 05/12/2023   Lab Results  Component Value Date   HAV REACTIVE (A) 01/30/2021   Lab Results   Component Value Date   HEPBSAG NON-REACTIVE 01/30/2021   HEPBSAB REACTIVE (A) 01/30/2021   No results found for: HCVAB Lab Results  Component Value Date   CHLAMYDIAWP Negative 06/08/2023   N Negative 06/08/2023   No results found for: GCPROBEAPT No results found for: QUANTGOLD  Assessment/Plan  #HIV -CD4 432, VL 911, on 05/12/23 after missing biktarvy  doses -F/U in 3 mtonhs for close montoring - he has a pratner hiv +, now confides with friends. Only one missed dose of biktarvy  -Pt plans to schedule counsleing appt.     #DM -f/u with pcp. A1c 7.7 k 05/12/23     #Vaccination COVID Flu Monkeypox PCV 20 06/24/21 Meningitis=->fist dose today HepA immune HEpB immune Tdap Shingles HPV utd   #Health maintenance -Quantiferon today 05/12/23 -RPR NR 05/12/23 -HCVnr 05/12/23 -GC3 point 4/23 with + gonorrhea anal. Ceftr today. Gc 3 pint today   Age dependent -Lipid -Dysplasia screen F/M -Colonoscopy  Loney Stank, MD [facility]

## 2023-09-21 DIAGNOSIS — Z5321 Procedure and treatment not carried out due to patient leaving prior to being seen by health care provider: Secondary | ICD-10-CM | POA: Insufficient documentation

## 2023-09-21 DIAGNOSIS — Z202 Contact with and (suspected) exposure to infections with a predominantly sexual mode of transmission: Secondary | ICD-10-CM | POA: Insufficient documentation

## 2023-09-21 DIAGNOSIS — K5900 Constipation, unspecified: Secondary | ICD-10-CM | POA: Insufficient documentation

## 2023-09-22 ENCOUNTER — Encounter (HOSPITAL_BASED_OUTPATIENT_CLINIC_OR_DEPARTMENT_OTHER): Payer: Self-pay

## 2023-09-22 ENCOUNTER — Emergency Department (HOSPITAL_BASED_OUTPATIENT_CLINIC_OR_DEPARTMENT_OTHER)
Admission: EM | Admit: 2023-09-22 | Discharge: 2023-09-22 | Discharge status: Against Medical Advice | Payer: Self-pay | Attending: Emergency Medicine | Admitting: Emergency Medicine

## 2023-09-22 ENCOUNTER — Other Ambulatory Visit: Payer: Self-pay

## 2023-09-22 ENCOUNTER — Encounter (HOSPITAL_COMMUNITY): Payer: Self-pay

## 2023-09-22 ENCOUNTER — Emergency Department (HOSPITAL_COMMUNITY)
Admission: EM | Admit: 2023-09-22 | Discharge: 2023-09-22 | Disposition: A | Discharge status: Home / Self Care | Payer: Self-pay | Attending: Emergency Medicine | Admitting: Emergency Medicine

## 2023-09-22 DIAGNOSIS — Z794 Long term (current) use of insulin: Secondary | ICD-10-CM | POA: Insufficient documentation

## 2023-09-22 DIAGNOSIS — R739 Hyperglycemia, unspecified: Secondary | ICD-10-CM

## 2023-09-22 DIAGNOSIS — K6289 Other specified diseases of anus and rectum: Secondary | ICD-10-CM | POA: Insufficient documentation

## 2023-09-22 DIAGNOSIS — Z21 Asymptomatic human immunodeficiency virus [HIV] infection status: Secondary | ICD-10-CM | POA: Insufficient documentation

## 2023-09-22 DIAGNOSIS — Z7984 Long term (current) use of oral hypoglycemic drugs: Secondary | ICD-10-CM | POA: Insufficient documentation

## 2023-09-22 DIAGNOSIS — E1165 Type 2 diabetes mellitus with hyperglycemia: Secondary | ICD-10-CM | POA: Insufficient documentation

## 2023-09-22 LAB — URINALYSIS, ROUTINE W REFLEX MICROSCOPIC
Bacteria, UA: NONE SEEN
Bilirubin Urine: NEGATIVE
Glucose, UA: >=500 mg/dL — AB
Hgb urine dipstick: NEGATIVE
Ketones, ur: NEGATIVE mg/dL
Leukocytes,Ua: NEGATIVE
Nitrite: NEGATIVE
Protein, ur: 100 mg/dL — AB
Specific Gravity, Urine: 1.031 — ABNORMAL HIGH (ref 1.005–1.030)
pH: 6 (ref 5.0–8.0)

## 2023-09-22 LAB — GC/CHLAMYDIA PROBE AMP (~~LOC~~) NOT AT ARMC
Chlamydia: NEGATIVE
Comment: NEGATIVE
Comment: NORMAL
Neisseria Gonorrhea: POSITIVE — AB

## 2023-09-22 LAB — CBC WITH DIFFERENTIAL/PLATELET
Abs Immature Granulocytes: 0.01 K/uL (ref 0.00–0.07)
Basophils Absolute: 0 K/uL (ref 0.0–0.1)
Basophils Relative: 0 %
Eosinophils Absolute: 0.1 K/uL (ref 0.0–0.5)
Eosinophils Relative: 1 %
HCT: 42.5 % (ref 39.0–52.0)
Hemoglobin: 14.3 g/dL (ref 13.0–17.0)
Immature Granulocytes: 0 %
Lymphocytes Relative: 43 %
Lymphs Abs: 2.2 K/uL (ref 0.7–4.0)
MCH: 29.7 pg (ref 26.0–34.0)
MCHC: 33.6 g/dL (ref 30.0–36.0)
MCV: 88.2 fL (ref 80.0–100.0)
Monocytes Absolute: 0.4 K/uL (ref 0.1–1.0)
Monocytes Relative: 8 %
Neutro Abs: 2.5 K/uL (ref 1.7–7.7)
Neutrophils Relative %: 48 %
Platelets: 155 K/uL (ref 150–400)
RBC: 4.82 MIL/uL (ref 4.22–5.81)
RDW: 12.7 % (ref 11.5–15.5)
WBC: 5.2 K/uL (ref 4.0–10.5)
nRBC: 0 % (ref 0.0–0.2)

## 2023-09-22 LAB — COMPREHENSIVE METABOLIC PANEL WITH GFR
ALT: 23 U/L (ref 0–44)
AST: 11 U/L — ABNORMAL LOW (ref 15–41)
Albumin: 4.1 g/dL (ref 3.5–5.0)
Alkaline Phosphatase: 53 U/L (ref 38–126)
Anion gap: 12 (ref 5–15)
BUN: 19 mg/dL (ref 6–20)
CO2: 24 mmol/L (ref 22–32)
Calcium: 9.1 mg/dL (ref 8.9–10.3)
Chloride: 104 mmol/L (ref 98–111)
Creatinine, Ser: 0.92 mg/dL (ref 0.61–1.24)
GFR, Estimated: >60 mL/min (ref >60)
Glucose, Bld: 255 mg/dL — ABNORMAL HIGH (ref 70–99)
Potassium: 3.7 mmol/L (ref 3.5–5.1)
Sodium: 139 mmol/L (ref 135–145)
Total Bilirubin: 0.6 mg/dL (ref 0.0–1.2)
Total Protein: 6.2 g/dL — ABNORMAL LOW (ref 6.5–8.1)

## 2023-09-22 LAB — CBG MONITORING, ED: Glucose-Capillary: 285 mg/dL — ABNORMAL HIGH (ref 70–99)

## 2023-09-22 MED ORDER — DOXYCYCLINE HYCLATE 100 MG PO CAPS: 100.0000 mg | ORAL_CAPSULE | Freq: Two times a day (BID) | ORAL | 0 refills | Status: AC

## 2023-09-22 MED ORDER — METFORMIN HCL 500 MG PO TABS: 500.0000 mg | ORAL_TABLET | Freq: Two times a day (BID) | ORAL | 0 refills | Status: AC

## 2023-09-22 MED ORDER — LIDOCAINE HCL 1 % IJ SOLN
INTRAMUSCULAR | Status: AC
  Administered 2023-09-22: 1.4 mL
  Filled 2023-09-22: qty 20

## 2023-09-22 MED ORDER — CEFTRIAXONE SODIUM 1 G IJ SOLR
500.0000 mg | Freq: Once | INTRAMUSCULAR | Status: AC
  Administered 2023-09-22: 500 mg via INTRAMUSCULAR
  Filled 2023-09-22: qty 10

## 2023-09-22 MED ORDER — LIDOCAINE HCL URETHRAL/MUCOSAL 2 % EX GEL
1.0000 | Freq: Once | CUTANEOUS | Status: AC
  Administered 2023-09-22: 1 via TOPICAL
  Filled 2023-09-22: qty 11

## 2023-09-22 MED ORDER — VALACYCLOVIR HCL 1 G PO TABS: 1000.0000 mg | ORAL_TABLET | Freq: Two times a day (BID) | ORAL | 0 refills | Status: AC

## 2023-09-22 NOTE — ED Triage Notes (Signed)
 Patient reports difficulty passing stool, feels like it may be related to an STI.  Would like to have it checked.

## 2023-09-22 NOTE — ED Notes (Signed)
 Gave pt a cup for UA, stated he was unable to go.

## 2023-09-22 NOTE — ED Notes (Signed)
 Verbalized leaving and going to another facility. Displays no signs of distress.

## 2023-09-22 NOTE — ED Provider Notes (Signed)
 [place] EMERGENCY DEPARTMENT AT [facility] Provider Note   CSN: [id number] Arrival date & time: 09/22/23  9950     Patient presents with: SEXUALLY TRANSMITTED DISEASE   Darryl Brown is a 28 y.o. male.   The history is provided by the patient.   Darryl Brown is a 28 y.o. male who presents to the Emergency Department complaining of rectal pain. She presents the emergency department for evaluation of rectal pain that started on Sunday. Stool is normal in caliber but he does have increased pain when trying to defecate. No fever, nausea, vomiting, Donel pain, dysuria, difficulty with urination. No penile or rectal discharge. He does have a history of HIV and is well controlled on medications. He does also have a history of type II diabetes but is not currently on medications and has not taken any for about one year due to not having a PCP. He is sexually active, no new partners.      Prior to Admission medications   Medication Sig Start Date End Date Taking? Authorizing Provider  doxycycline  (VIBRAMYCIN ) 100 MG capsule Take 1 capsule (100 mg total) by mouth 2 (two) times daily. 09/22/23  Yes Griselda Norris, MD  metFORMIN  (GLUCOPHAGE ) 500 MG tablet Take 1 tablet (500 mg total) by mouth 2 (two) times daily with a meal. 09/22/23  Yes Griselda Norris, MD  valACYclovir  (VALTREX ) 1000 MG tablet Take 1 tablet (1,000 mg total) by mouth 2 (two) times daily. 09/22/23  Yes Griselda Norris, MD  acetaminophen  (TYLENOL ) 325 MG tablet Take 650 mg by mouth every 6 (six) hours as needed for mild pain. Patient not taking: Reported on 06/08/2023    [provider]  amoxicillin -clavulanate (AUGMENTIN ) 875-125 MG tablet Take 1 tablet by mouth every 12 (twelve) hours. 06/19/23   Raspet, Erin K, PA-C  bictegravir-emtricitabine-tenofovir AF (BIKTARVY ) 50-200-25 MG TABS tablet Take 1 tablet by mouth daily. 06/08/23   Dennise Kingsley, MD  cetirizine  (ZYRTEC ) 10 MG tablet Take 1 tablet (10 mg  total) by mouth daily. 06/24/23   Kehrli, Kelsey F, PA-C  Cholecalciferol 1.25 MG (50000 UT) capsule Take 1 capsule by mouth once a week. Patient not taking: Reported on 06/08/2023 06/19/20   [provider]  clotrimazole  (LOTRIMIN ) 1 % cream Apply 1 Application topically 2 (two) times daily. Patient not taking: Reported on 06/08/2023 02/27/22   Orlando Burnard SAUNDERS, PA-C  dapagliflozin  propanediol (FARXIGA ) 10 MG TABS tablet Take 1 tablet (10 mg total) by mouth daily. 02/27/22   Orlando Burnard SAUNDERS, PA-C  escitalopram (LEXAPRO) 10 MG tablet Take 10 mg by mouth daily. Patient not taking: Reported on 06/08/2023 01/03/21   [provider]  fluticasone  (FLONASE ) 50 MCG/ACT nasal spray Place 2 sprays into both nostrils daily. 06/24/23   Alva Larraine FALCON, PA-C  glipiZIDE  (GLUCOTROL  XL) 10 MG 24 hr tablet Take 1 tablet (10 mg total) by mouth daily. Patient not taking: Reported on 06/08/2023 02/27/22   Orlando Burnard SAUNDERS, PA-C  Insulin  Pen Needle (UNIFINE PENTIPS) 32G X 4 MM MISC Use 1 pen needle once daily. Patient not taking: Reported on 06/08/2023 06/20/21   Emmy Justus DEL, MD  lidocaine  (LIDODERM ) 5 % Place 1 patch onto the skin daily. Remove & Discard patch within 12 hours or as directed by MD Patient not taking: Reported on 06/08/2023 03/10/22   Raspet, Erin K, PA-C  liraglutide  (VICTOZA ) 18 MG/3ML SOPN Inject 0.6 mg into the skin daily. Patient not taking: Reported on 06/08/2023 02/27/22   Orlando Burnard  R, PA-C  promethazine -dextromethorphan (PROMETHAZINE -DM) 6.25-15 MG/5ML syrup Take 5 mLs by mouth 3 (three) times daily as needed for cough. 06/19/23   Raspet, Erin K, PA-C    Allergies: Metformin  and Sertraline    Review of Systems  All other systems reviewed and are negative.   Updated Vital Signs BP (!) 142/86   Pulse 79   Temp 98.2 F (36.8 C) (Oral)   Resp 17   Ht 6' 2 (1.88 m)   Wt 97.5 kg   SpO2 96%   BMI 27.60 kg/m   Physical Exam Vitals and nursing note reviewed.   Constitutional:      Appearance: He is well-developed.  HENT:     Head: Normocephalic and atraumatic.  Cardiovascular:     Rate and Rhythm: Normal rate and regular rhythm.  Pulmonary:     Effort: Pulmonary effort is normal. No respiratory distress.  Abdominal:     Palpations: Abdomen is soft.     Tenderness: There is no abdominal tenderness. There is no guarding or rebound.  Genitourinary:    Comments: There is a tiny vesicle at the right anal verge with tenderness to palpation. No palpable masses. Musculoskeletal:        General: No tenderness.  Skin:    General: Skin is warm and dry.  Neurological:     Mental Status: He is alert and oriented to person, place, and time.  Psychiatric:        Behavior: Behavior normal.     (all labs ordered are listed, but only abnormal results are displayed) Labs Reviewed  COMPREHENSIVE METABOLIC PANEL WITH GFR - Abnormal; Notable for the following components:      Result Value   Glucose, Bld 255 (*)    Total Protein 6.2 (*)    AST 11 (*)    All other components within normal limits  URINALYSIS, ROUTINE W REFLEX MICROSCOPIC - Abnormal; Notable for the following components:   Specific Gravity, Urine 1.031 (*)    Glucose, UA >=500 (*)    Protein, ur 100 (*)    All other components within normal limits  CBG MONITORING, ED - Abnormal; Notable for the following components:   Glucose-Capillary 285 (*)    All other components within normal limits  CBC WITH DIFFERENTIAL/PLATELET  GC/CHLAMYDIA PROBE AMP ([place]) NOT AT [facility]    EKG: None  Radiology: No results found.   Procedures   Medications Ordered in the ED  lidocaine  (XYLOCAINE ) 2 % jelly 1 Application (1 Application Topical Given 09/22/23 0520)  cefTRIAXone  (ROCEPHIN ) injection 500 mg (500 mg Intramuscular Given 09/22/23 0520)  lidocaine  (XYLOCAINE ) 1 % (with pres) injection (1.4 mLs  Given 09/22/23 0520)                                    Medical Decision Making Amount  and/or Complexity of Data Reviewed Labs: ordered.  Risk Prescription drug management.   Patient with history of well-controlled HIV, diabetes here for evaluation of rectal pain. No evidence of perirectal abscess on examination. He has experienced similar symptoms in the past secondary to STI. Will send GC swab and treat empirically with antibiotics. He does have one punctate area that is concerning for small vesicle, will also start empiric treatment for possible genital herpes. Of note he does have a history of diabetes, has not followed up with primary care due to lack of insurance. Labs were sent due  to being out of his medications for about a year. Labs do demonstrate hyperglycemia, no significant electrolyte abnormality. Will restart his metformin , which she does report he tolerated well in the past. Discussed slowly starting. Case management consulted regarding establishing PCP for his diabetes. Discussed with patient following up with ID as well. Discussed outpatient follow-up and return precautions.     Final diagnoses:  Rectal pain  Hyperglycemia    ED Discharge Orders          Ordered    valACYclovir  (VALTREX ) 1000 MG tablet  2 times daily        09/22/23 0502    metFORMIN  (GLUCOPHAGE ) 500 MG tablet  2 times daily with meals        09/22/23 0502    doxycycline  (VIBRAMYCIN ) 100 MG capsule  2 times daily        09/22/23 0502               Griselda Norris, MD 09/22/23 [phone]

## 2023-09-22 NOTE — ED Triage Notes (Signed)
 Pt states he is having trouble passing stool since Sunday. C/o itching and irritation on his rectum. Pt wants to be checked for STDs.

## 2023-09-23 ENCOUNTER — Ambulatory Visit (HOSPITAL_COMMUNITY): Payer: Self-pay

## 2023-10-13 ENCOUNTER — Encounter: Payer: Self-pay | Admitting: Internal Medicine

## 2023-10-13 ENCOUNTER — Other Ambulatory Visit: Payer: Self-pay

## 2023-10-13 ENCOUNTER — Ambulatory Visit: Payer: Self-pay

## 2023-10-13 ENCOUNTER — Ambulatory Visit: Payer: Self-pay | Admitting: Internal Medicine

## 2023-10-13 VITALS — BP 133/86 | HR 95 | Temp 98.2°F | Resp 16 | Wt 209.0 lb

## 2023-10-13 DIAGNOSIS — Z7985 Long-term (current) use of injectable non-insulin antidiabetic drugs: Secondary | ICD-10-CM

## 2023-10-13 DIAGNOSIS — Z23 Encounter for immunization: Secondary | ICD-10-CM

## 2023-10-13 DIAGNOSIS — E119 Type 2 diabetes mellitus without complications: Secondary | ICD-10-CM

## 2023-10-13 DIAGNOSIS — Z79899 Other long term (current) drug therapy: Secondary | ICD-10-CM

## 2023-10-13 DIAGNOSIS — Z113 Encounter for screening for infections with a predominantly sexual mode of transmission: Secondary | ICD-10-CM

## 2023-10-13 DIAGNOSIS — B20 Human immunodeficiency virus [HIV] disease: Secondary | ICD-10-CM

## 2023-10-13 MED ORDER — BIKTARVY 50-200-25 MG PO TABS: 1.0000 | ORAL_TABLET | Freq: Every day | ORAL | 12 refills | Status: AC

## 2023-10-13 NOTE — Progress Notes (Signed)
 [facility]     HPI: Darryl Brown is a67 y.o. male presents for HIV management on bIktarvy . Pt has been sexually acitve since 9/3(gonorrhea treatment), 2 partners. Would like sti teseitng Today 06/08/23: Date of diagnosis: 2021 ART exposure biktarvy  Past Ois none Risk factors: MSM Anal sex verse Vaginal penile sex, contraception sometime   Social: Occupation: hospitlaity.  Housing: apt by himself Support: minimal Understanding of HIV: Etoh/drug/tobacco use:  Etoh abuse/mj/ no  Past Medical History:  Diagnosis Date   Diabetes mellitus without complication (HCC)    HIV (human immunodeficiency virus infection) (HCC)    Obesity     No past surgical history on file.  No family history on file. Current Outpatient Medications on File Prior to Visit  Medication Sig Dispense Refill   acetaminophen  (TYLENOL ) 325 MG tablet Take 650 mg by mouth every 6 (six) hours as needed for mild pain. (Patient not taking: Reported on 06/08/2023)     amoxicillin -clavulanate (AUGMENTIN ) 875-125 MG tablet Take 1 tablet by mouth every 12 (twelve) hours. 14 tablet 0   bictegravir-emtricitabine-tenofovir AF (BIKTARVY ) 50-200-25 MG TABS tablet Take 1 tablet by mouth daily. 30 tablet 6   cetirizine  (ZYRTEC ) 10 MG tablet Take 1 tablet (10 mg total) by mouth daily. 30 tablet 0   Cholecalciferol 1.25 MG (50000 UT) capsule Take 1 capsule by mouth once a week. (Patient not taking: Reported on 06/08/2023)     clotrimazole  (LOTRIMIN ) 1 % cream Apply 1 Application topically 2 (two) times daily. (Patient not taking: Reported on 06/08/2023) 30 g 0   dapagliflozin  propanediol (FARXIGA ) 10 MG TABS tablet Take 1 tablet (10 mg total) by mouth daily. 30 tablet 0   doxycycline  (VIBRAMYCIN ) 100 MG capsule Take 1 capsule (100 mg total) by mouth 2 (two) times daily. 14 capsule 0   escitalopram (LEXAPRO) 10 MG tablet Take 10 mg by mouth daily. (Patient not taking: Reported on 06/08/2023)      fluticasone  (FLONASE ) 50 MCG/ACT nasal spray Place 2 sprays into both nostrils daily. 11.1 mL 0   glipiZIDE  (GLUCOTROL  XL) 10 MG 24 hr tablet Take 1 tablet (10 mg total) by mouth daily. (Patient not taking: Reported on 06/08/2023) 30 tablet 0   Insulin  Pen Needle (UNIFINE PENTIPS) 32G X 4 MM MISC Use 1 pen needle once daily. (Patient not taking: Reported on 06/08/2023) 100 each 3   lidocaine  (LIDODERM ) 5 % Place 1 patch onto the skin daily. Remove & Discard patch within 12 hours or as directed by MD (Patient not taking: Reported on 06/08/2023) 10 patch 0   liraglutide  (VICTOZA ) 18 MG/3ML SOPN Inject 0.6 mg into the skin daily. (Patient not taking: Reported on 06/08/2023) 3 mL 0   metFORMIN  (GLUCOPHAGE ) 500 MG tablet Take 1 tablet (500 mg total) by mouth 2 (two) times daily with a meal. 60 tablet 0   promethazine -dextromethorphan (PROMETHAZINE -DM) 6.25-15 MG/5ML syrup Take 5 mLs by mouth 3 (three) times daily as needed for cough. 118 mL 0   valACYclovir  (VALTREX ) 1000 MG tablet Take 1 tablet (1,000 mg total) by mouth 2 (two) times daily. 20 tablet 0   No current facility-administered medications on file prior to visit.    Allergies  Allergen Reactions   Metformin  Other (See Comments)   Sertraline Other (See Comments)    Night sweats      Lab Results HIV 1 RNA Quant  Date Value  06/08/2023 NOT DETECTED copies/mL  05/12/2023 911 copies/mL (H)  02/27/2022 <  20 Copies/mL (H)   CD4 T Cell Abs (/uL)  Date Value  05/12/2023 432  06/24/2021 433  01/30/2021 289 (L)   No results found for: HIV1GENOSEQ Lab Results  Component Value Date   WBC 5.2 09/22/2023   HGB 14.3 09/22/2023   HCT 42.5 09/22/2023   MCV 88.2 09/22/2023   PLT 155 09/22/2023    Lab Results  Component Value Date   CREATININE 0.92 09/22/2023   BUN 19 09/22/2023   NA 139 09/22/2023   K 3.7 09/22/2023   CL 104 09/22/2023   CO2 24 09/22/2023   Lab Results  Component Value Date   ALT 23 09/22/2023   AST 11 (L)  09/22/2023   ALKPHOS 53 09/22/2023   BILITOT 0.6 09/22/2023    Lab Results  Component Value Date   CHOL 151 05/12/2023   TRIG 50 05/12/2023   HDL 59 05/12/2023   LDLCALC 80 05/12/2023   Lab Results  Component Value Date   HAV REACTIVE (A) 01/30/2021   Lab Results  Component Value Date   HEPBSAG NON-REACTIVE 01/30/2021   HEPBSAB REACTIVE (A) 01/30/2021   No results found for: HCVAB Lab Results  Component Value Date   CHLAMYDIAWP Negative 09/22/2023   N Positive (A) 09/22/2023   No results found for: GCPROBEAPT No results found for: QUANTGOLD  Assessment/Plan #HIV -CD4 432, VL nd1, on 06/08/23  - pt missed a week of biktarvy (about 1  week ago).  - he has a pratner hiv +, now confides with friends.  -labs today -f/u 6 months     #DM -f/u with pcp. A1c 7.7 k 05/12/23    #sti testing -gc 3 pint, rpr  #referral to PCP  #Vaccination COVID Flu-today Monkeypox PCV 20 06/24/21 Meningitis=->fist dose 5/25, nv HepA immune HEpB immune Tdap Shingles   #Health maintenance -Quantiferon tnr 05/12/23 -RPR today -HCVnr 05/12/23 -GC3-anal gonorhea +on 09/22/23 ps ctx.    Age dependent -Lipid -Dysplasia screen F/M -Colonoscopy      Loney Stank, MD [facility] I have personally spent 34 minutes involved in face-to-face and non-face-to-face activities for this patient on the day of the visit. Professional time spent includes the following activities: Preparing to see the patient (review of tests), Obtaining and/or reviewing separately obtained history (admission/discharge record), Performing a medically appropriate examination and/or evaluation , Ordering medications/tests/procedures, referring and communicating with other health care professionals, Documenting clinical information in the EMR, Independently interpreting results (not separately reported), Communicating results to the patient/family/caregiver,  Counseling and educating the patient/family/caregiver and Care coordination (not separately reported).

## 2023-10-14 LAB — CYTOLOGY, (ORAL, ANAL, URETHRAL) ANCILLARY ONLY
Chlamydia: NEGATIVE
Chlamydia: NEGATIVE
Comment: NEGATIVE
Comment: NEGATIVE
Comment: NORMAL
Comment: NORMAL
Neisseria Gonorrhea: NEGATIVE
Neisseria Gonorrhea: NEGATIVE

## 2023-10-14 LAB — URINE CYTOLOGY ANCILLARY ONLY
Chlamydia: NEGATIVE
Comment: NEGATIVE
Comment: NORMAL
Neisseria Gonorrhea: NEGATIVE

## 2023-10-14 LAB — T-HELPER CELLS (CD4) COUNT (NOT AT ARMC)
CD4 % Helper T Cell: 36 % (ref 33–65)
CD4 T Cell Abs: 507 /uL (ref 400–1790)

## 2023-10-16 LAB — COMPLETE METABOLIC PANEL WITHOUT GFR
AG Ratio: 1.8 (calc) (ref 1.0–2.5)
ALT: 31 U/L (ref 9–46)
AST: 16 U/L (ref 10–40)
Albumin: 4.4 g/dL (ref 3.6–5.1)
Alkaline phosphatase (APISO): 50 U/L (ref 36–130)
BUN: 16 mg/dL (ref 7–25)
CO2: 30 mmol/L (ref 20–32)
Calcium: 9.3 mg/dL (ref 8.6–10.3)
Chloride: 103 mmol/L (ref 98–110)
Creat: 1.06 mg/dL (ref 0.60–1.24)
Globulin: 2.5 g/dL (ref 1.9–3.7)
Glucose, Bld: 304 mg/dL — ABNORMAL HIGH (ref 65–99)
Potassium: 4.7 mmol/L (ref 3.5–5.3)
Sodium: 138 mmol/L (ref 135–146)
Total Bilirubin: 0.7 mg/dL (ref 0.2–1.2)
Total Protein: 6.9 g/dL (ref 6.1–8.1)

## 2023-10-16 LAB — CBC WITH DIFFERENTIAL/PLATELET
Absolute Lymphocytes: 1517 {cells}/uL (ref 850–3900)
Absolute Monocytes: 217 {cells}/uL (ref 200–950)
Basophils Absolute: 41 {cells}/uL (ref 0–200)
Basophils Relative: 1 %
Eosinophils Absolute: 29 {cells}/uL (ref 15–500)
Eosinophils Relative: 0.7 %
HCT: 48.4 % (ref 38.5–50.0)
Hemoglobin: 16 g/dL (ref 13.2–17.1)
MCH: 29.6 pg (ref 27.0–33.0)
MCHC: 33.1 g/dL (ref 32.0–36.0)
MCV: 89.6 fL (ref 80.0–100.0)
MPV: 11.9 fL (ref 7.5–12.5)
Monocytes Relative: 5.3 %
Neutro Abs: 2296 {cells}/uL (ref 1500–7800)
Neutrophils Relative %: 56 %
Platelets: 185 Thousand/uL (ref 140–400)
RBC: 5.4 Million/uL (ref 4.20–5.80)
RDW: 12 % (ref 11.0–15.0)
Total Lymphocyte: 37 %
WBC: 4.1 Thousand/uL (ref 3.8–10.8)

## 2023-10-16 LAB — HIV-1 RNA QUANT-NO REFLEX-BLD
HIV 1 RNA Quant: 44 {copies}/mL — ABNORMAL HIGH
HIV-1 RNA Quant, Log: 1.64 {Log_copies}/mL — ABNORMAL HIGH

## 2023-10-16 LAB — T PALLIDUM AB: T Pallidum Abs: POSITIVE — AB

## 2023-10-16 LAB — RPR: RPR Ser Ql: REACTIVE — AB

## 2023-10-16 LAB — RPR TITER: RPR Titer: 1:1 {titer} — ABNORMAL HIGH

## 2024-04-10 ENCOUNTER — Ambulatory Visit: Payer: Self-pay | Admitting: Internal Medicine
